# Patient Record
Sex: Female | Born: 1980
Health system: Southern US, Community
[De-identification: ages and names within clinical notes are randomized; demographics above are authoritative.]

## PROBLEM LIST (undated history)

## (undated) ENCOUNTER — Inpatient Hospital Stay (HOSPITAL_COMMUNITY): Payer: Self-pay

## (undated) DIAGNOSIS — R12 Heartburn: Secondary | ICD-10-CM

## (undated) DIAGNOSIS — M76829 Posterior tibial tendinitis, unspecified leg: Secondary | ICD-10-CM

## (undated) DIAGNOSIS — D649 Anemia, unspecified: Secondary | ICD-10-CM

## (undated) DIAGNOSIS — Z789 Other specified health status: Secondary | ICD-10-CM

## (undated) DIAGNOSIS — O26899 Other specified pregnancy related conditions, unspecified trimester: Secondary | ICD-10-CM

## (undated) HISTORY — DX: Posterior tibial tendinitis, unspecified leg: M76.829

## (undated) HISTORY — PX: DILATION AND CURETTAGE OF UTERUS: SHX78

---

## 2006-11-10 ENCOUNTER — Ambulatory Visit: Payer: Self-pay | Admitting: Internal Medicine

## 2006-11-10 DIAGNOSIS — R5382 Chronic fatigue, unspecified: Secondary | ICD-10-CM

## 2006-11-10 LAB — CONVERTED CEMR LAB
Basophils Absolute: 0.1 10*3/uL (ref 0.0–0.1)
Eosinophils Absolute: 0.3 10*3/uL (ref 0.0–0.7)
Eosinophils Relative: 3 % (ref 0–5)
Neutro Abs: 6.2 10*3/uL (ref 1.7–7.7)

## 2006-11-11 LAB — CONVERTED CEMR LAB
ALT: 11 units/L (ref 0–35)
AST: 16 units/L (ref 0–37)
Alkaline Phosphatase: 71 units/L (ref 39–117)
BUN: 8 mg/dL (ref 6–23)
HCT: 36.4 % (ref 36.0–46.0)
MCHC: 33.7 g/dL (ref 30.0–36.0)
MCV: 86.5 fL (ref 78.0–100.0)
Platelets: 388 10*3/uL (ref 150–400)
RDW: 12.7 % (ref 11.5–14.0)
Sodium: 137 meq/L (ref 135–145)
TSH: 1.268 microintl units/mL (ref 0.350–5.50)
Total Bilirubin: 0.4 mg/dL (ref 0.3–1.2)
Total Protein: 6.3 g/dL (ref 6.0–8.3)

## 2007-11-02 ENCOUNTER — Emergency Department (HOSPITAL_COMMUNITY): Admission: EM | Admit: 2007-11-02 | Discharge: 2007-11-02 | Payer: Self-pay | Admitting: Emergency Medicine

## 2008-01-24 ENCOUNTER — Telehealth (INDEPENDENT_AMBULATORY_CARE_PROVIDER_SITE_OTHER): Payer: Self-pay | Admitting: *Deleted

## 2008-06-04 ENCOUNTER — Ambulatory Visit (HOSPITAL_COMMUNITY): Admission: RE | Admit: 2008-06-04 | Discharge: 2008-06-04 | Payer: Self-pay | Admitting: Obstetrics and Gynecology

## 2008-06-04 ENCOUNTER — Encounter (HOSPITAL_COMMUNITY): Payer: Self-pay | Admitting: Obstetrics and Gynecology

## 2009-07-28 ENCOUNTER — Inpatient Hospital Stay (HOSPITAL_COMMUNITY): Admission: AD | Admit: 2009-07-28 | Discharge: 2009-08-01 | Payer: Self-pay | Admitting: Obstetrics and Gynecology

## 2009-07-29 ENCOUNTER — Encounter (INDEPENDENT_AMBULATORY_CARE_PROVIDER_SITE_OTHER): Payer: Self-pay | Admitting: Obstetrics and Gynecology

## 2010-04-13 LAB — CBC
Hemoglobin: 10.6 g/dL — ABNORMAL LOW (ref 12.0–15.0)
Hemoglobin: 12.4 g/dL (ref 12.0–15.0)
MCHC: 34.3 g/dL (ref 30.0–36.0)
Platelets: 237 10*3/uL (ref 150–400)
Platelets: 274 10*3/uL (ref 150–400)
RDW: 14.9 % (ref 11.5–15.5)
WBC: 9.9 10*3/uL (ref 4.0–10.5)

## 2010-05-06 LAB — CBC
HCT: 38.2 % (ref 36.0–46.0)
Hemoglobin: 13.6 g/dL (ref 12.0–15.0)
MCHC: 35.5 g/dL (ref 30.0–36.0)
Platelets: 324 10*3/uL (ref 150–400)
RDW: 12.8 % (ref 11.5–15.5)

## 2010-12-02 LAB — OB RESULTS CONSOLE GC/CHLAMYDIA
Chlamydia: NEGATIVE
Gonorrhea: NEGATIVE

## 2010-12-02 LAB — OB RESULTS CONSOLE ABO/RH: RH Type: POSITIVE

## 2010-12-02 LAB — OB RESULTS CONSOLE HEPATITIS B SURFACE ANTIGEN: Hepatitis B Surface Ag: NEGATIVE

## 2010-12-24 ENCOUNTER — Emergency Department (HOSPITAL_BASED_OUTPATIENT_CLINIC_OR_DEPARTMENT_OTHER): Payer: 59

## 2010-12-24 ENCOUNTER — Emergency Department (HOSPITAL_BASED_OUTPATIENT_CLINIC_OR_DEPARTMENT_OTHER)
Admission: EM | Admit: 2010-12-24 | Discharge: 2010-12-24 | Disposition: A | Discharge status: Home / Self Care | Payer: 59 | Attending: Emergency Medicine | Admitting: Emergency Medicine

## 2010-12-24 ENCOUNTER — Encounter: Payer: Self-pay | Admitting: Emergency Medicine

## 2010-12-24 DIAGNOSIS — E860 Dehydration: Secondary | ICD-10-CM | POA: Insufficient documentation

## 2010-12-24 DIAGNOSIS — R1110 Vomiting, unspecified: Secondary | ICD-10-CM

## 2010-12-24 DIAGNOSIS — B349 Viral infection, unspecified: Secondary | ICD-10-CM

## 2010-12-24 DIAGNOSIS — B9789 Other viral agents as the cause of diseases classified elsewhere: Secondary | ICD-10-CM | POA: Insufficient documentation

## 2010-12-24 DIAGNOSIS — O211 Hyperemesis gravidarum with metabolic disturbance: Secondary | ICD-10-CM | POA: Insufficient documentation

## 2010-12-24 LAB — URINALYSIS, ROUTINE W REFLEX MICROSCOPIC
Bilirubin Urine: NEGATIVE
Ketones, ur: 15 mg/dL — AB
Leukocytes, UA: NEGATIVE
Nitrite: NEGATIVE
Specific Gravity, Urine: 1.028 (ref 1.005–1.030)
Urobilinogen, UA: 0.2 mg/dL (ref 0.0–1.0)
pH: 6 (ref 5.0–8.0)

## 2010-12-24 MED ORDER — SODIUM CHLORIDE 0.9 % IV BOLUS (SEPSIS)
1000.0000 mL | Freq: Once | INTRAVENOUS | Status: DC
Start: 2010-12-24 — End: 2010-12-24

## 2010-12-24 MED ORDER — VITAMIN B-6 25 MG PO TABS: 25.0000 mg | ORAL_TABLET | Freq: Four times a day (QID) | ORAL | Status: DC | PRN

## 2010-12-24 MED ORDER — SODIUM CHLORIDE 0.9 % IV BOLUS (SEPSIS)
1000.0000 mL | Freq: Once | INTRAVENOUS | Status: AC
  Administered 2010-12-24: 1000 mL via INTRAVENOUS

## 2010-12-24 MED ORDER — ONDANSETRON 4 MG PO TBDP: 4.0000 mg | ORAL_TABLET | Freq: Three times a day (TID) | ORAL | Status: AC | PRN

## 2010-12-24 MED ORDER — ALBUTEROL SULFATE HFA 108 (90 BASE) MCG/ACT IN AERS: 1.0000 | INHALATION_SPRAY | RESPIRATORY_TRACT | Status: DC | PRN

## 2010-12-24 MED ORDER — ONDANSETRON HCL 4 MG/2ML IJ SOLN
4.0000 mg | Freq: Once | INTRAMUSCULAR | Status: AC
  Administered 2010-12-24: 4 mg via INTRAVENOUS
  Filled 2010-12-24: qty 2

## 2010-12-24 NOTE — ED Notes (Signed)
Family at bedside. 

## 2010-12-24 NOTE — ED Notes (Signed)
Pt presents with Body aches nausea and flu

## 2010-12-24 NOTE — ED Notes (Signed)
Pt unable to void at this time. 

## 2010-12-24 NOTE — ED Provider Notes (Signed)
History     CSN: [id number] Arrival date & time: 12/24/2010  7:34 AM   First MD Initiated Contact with Patient 12/24/10 [phone]      Chief Complaint  Patient presents with  . Nausea    persistant Nausea with body aches     HPI  30yoF G2P1 [redacted] weeks pregnant h/o asthma p/w multiple complaints. Patient complaining of approximately 4 days of fever, rhinorrhea, nasal congestion, sore throat, myalgias. Min cough. States that since onset of above her nausea and vomiting during this pregnancy is increased significantly. She has several episodes of emesis per day. She is unable to tolerate by mouth intake. Her husband feels like she is dehydrated. She has received an ultrasound in this pregnancy and has a known IUP. She denies abdominal pain, back pain, vaginal discharge, vaginal bleeding. Denies hematuria/dysuria/freq/urgency of urination. She did have a flu shot this season. She took tylenol pta but states she vomited shortly thereafter. Patient is Physician [facility].  History reviewed. No pertinent past medical history.  History reviewed. No pertinent past surgical history.  History reviewed. No pertinent family history.  History  Substance Use Topics  . Smoking status: Never Smoker   . Smokeless tobacco: Not on file  . Alcohol Use: No    OB History    Grav Para Term Preterm Abortions TAB SAB Ect Mult Living   1              Review of Systems  All other systems reviewed and are negative.  except as noted HPI  Allergies  Review of patient's allergies indicates no known allergies.  Home Medications   Current Outpatient Rx  Name Route Sig Dispense Refill  . ALBUTEROL SULFATE 2 MG/5ML PO SYRP Oral Take 2 mg by mouth 3 (three) times daily.      Marland Kitchen PRENATAL 27-0.8 MG PO TABS Oral Take 1 tablet by mouth daily.      . ALBUTEROL SULFATE HFA 108 (90 BASE) MCG/ACT IN AERS Inhalation Inhale 1-2 puffs into the lungs every 4 (four) hours as needed for wheezing. 1  Inhaler 10  . ONDANSETRON 4 MG PO TBDP Oral Take 1 tablet (4 mg total) by mouth every 8 (eight) hours as needed for nausea. 20 tablet 2  . VITAMIN B-6 25 MG PO TABS Oral Take 1 tablet (25 mg total) by mouth every 6 (six) hours as needed. 30 tablet 2    BP 96/45  Pulse 107  Temp(Src) 98.1 F (36.7 C) (Oral)  Resp 18  Ht 5\' 2"  (1.575 m)  Wt 186 lb (84.369 kg)  BMI 34.02 kg/m2  SpO2 100%  LMP 09/13/2010  Physical Exam  Nursing note and vitals reviewed. Constitutional: She is oriented to person, place, and time. She appears well-developed.  HENT:  Head: Atraumatic.  Mouth/Throat: No oropharyngeal exudate.       Nasal congestion Mild posterior OP erythema TM b/l without erythema Lt TM dull light relex  Eyes: Conjunctivae and EOM are normal. Pupils are equal, round, and reactive to light.  Neck: Normal range of motion. Neck supple.  Cardiovascular: Normal rate, regular rhythm, normal heart sounds and intact distal pulses.   Pulmonary/Chest: Effort normal and breath sounds normal. No respiratory distress. She has no wheezes. She has no rales.  Abdominal: Soft. She exhibits no distension. There is no tenderness. There is no rebound and no guarding.  Musculoskeletal: Normal range of motion.  Neurological: She is alert and oriented to person, place, and  time.  Skin: Skin is warm and dry. No rash noted.  Psychiatric: She has a normal mood and affect.    ED Course  Procedures (including critical care time)  Labs Reviewed  URINALYSIS, ROUTINE W REFLEX MICROSCOPIC - Abnormal; Notable for the following:    Ketones, ur 15 (*)    All other components within normal limits  RAPID STREP SCREEN  STREP A DNA PROBE   No results found.   1. Viral syndrome   2. Dehydration   3. Hyperemesis     MDM  Likely viral syndrome. Strep negative, sent for culture.  Mild hyperemesis/dehydration.  4742 Patient states nausea resolved. Still unable to produce urine same. Additional 1L IVF ordered.  Reassess.  1157AM Pt feels like she's ready to go home. Aware strep culture pending. U/A with 15 ketones. Given additional 1L IVF here. She is tolerating PO without vomiting or nausea. Home with OBGYN f/u, Vit B6, zofran. Will refill her albuterol inhaler.        Forbes Cellar, MD 12/24/10 1159

## 2010-12-25 LAB — STREP A DNA PROBE: Group A Strep Probe: NEGATIVE

## 2011-03-14 ENCOUNTER — Inpatient Hospital Stay (HOSPITAL_COMMUNITY)
Admission: AD | Admit: 2011-03-14 | Discharge: 2011-03-14 | Disposition: A | Discharge status: Home / Self Care | Payer: 59 | Source: Ambulatory Visit | Attending: Obstetrics and Gynecology | Admitting: Obstetrics and Gynecology

## 2011-03-14 ENCOUNTER — Encounter (HOSPITAL_COMMUNITY): Payer: Self-pay | Admitting: *Deleted

## 2011-03-14 DIAGNOSIS — N760 Acute vaginitis: Secondary | ICD-10-CM | POA: Insufficient documentation

## 2011-03-14 DIAGNOSIS — O479 False labor, unspecified: Secondary | ICD-10-CM

## 2011-03-14 DIAGNOSIS — A499 Bacterial infection, unspecified: Secondary | ICD-10-CM | POA: Insufficient documentation

## 2011-03-14 DIAGNOSIS — O2390 Unspecified genitourinary tract infection in pregnancy, unspecified trimester: Secondary | ICD-10-CM | POA: Insufficient documentation

## 2011-03-14 DIAGNOSIS — O4700 False labor before 37 completed weeks of gestation, unspecified trimester: Secondary | ICD-10-CM | POA: Insufficient documentation

## 2011-03-14 DIAGNOSIS — B9689 Other specified bacterial agents as the cause of diseases classified elsewhere: Secondary | ICD-10-CM | POA: Insufficient documentation

## 2011-03-14 LAB — URINALYSIS, ROUTINE W REFLEX MICROSCOPIC
Bilirubin Urine: NEGATIVE
Ketones, ur: NEGATIVE mg/dL
Leukocytes, UA: NEGATIVE
Protein, ur: NEGATIVE mg/dL
pH: 6.5 (ref 5.0–8.0)

## 2011-03-14 LAB — WET PREP, GENITAL

## 2011-03-14 MED ORDER — METRONIDAZOLE 500 MG PO TABS: 500.0000 mg | ORAL_TABLET | Freq: Two times a day (BID) | ORAL | Status: AC

## 2011-03-14 NOTE — ED Provider Notes (Signed)
History     Chief Complaint  Patient presents with  . Vaginal Bleeding  . Contractions   HPI  Pt here with report of intermittent contractions, reports 3 in 1.5 hours; states they began yesterday.  Denies vaginal bleeding, UTI symptoms, or leaking of fluid.  No abnormal vaginal discharge.  Past Medical History  Diagnosis Date  . Asthma     Past Surgical History  Procedure Date  . Dilation and curettage of uterus   . Cesarean section     Family History  Problem Relation Age of Onset  . Anesthesia problems Neg Hx   . Hypotension Neg Hx   . Pseudochol deficiency Neg Hx   . Malignant hyperthermia Neg Hx     History  Substance Use Topics  . Smoking status: Never Smoker   . Smokeless tobacco: Not on file  . Alcohol Use: No    Allergies: No Known Allergies  Prescriptions prior to admission  Medication Sig Dispense Refill  . albuterol (PROVENTIL HFA;VENTOLIN HFA) 108 (90 BASE) MCG/ACT inhaler Inhale 1-2 puffs into the lungs every 4 (four) hours as needed.      Marland Kitchen albuterol (PROVENTIL,VENTOLIN) 2 MG/5ML syrup Take 2 mg by mouth 3 (three) times daily.        . Prenatal Vit-Fe Fumarate-FA (PRENATAL MULTIVITAMIN) TABS Take 1 tablet by mouth daily.      Marland Kitchen DISCONTD: albuterol (PROVENTIL HFA;VENTOLIN HFA) 108 (90 BASE) MCG/ACT inhaler Inhale 1-2 puffs into the lungs every 4 (four) hours as needed for wheezing.  1 Inhaler  10    Review of Systems  Gastrointestinal: Positive for abdominal pain (braxton hicks).  All other systems reviewed and are negative.   Physical Exam   Temperature 98 F (36.7 C), temperature source Oral, height 5\' 2"  (1.575 m), weight 94.348 kg (208 lb), last menstrual period 09/13/2010, unknown if currently breastfeeding.  Physical Exam  Constitutional: She is oriented to person, place, and time. She appears well-developed and well-nourished. No distress.  HENT:  Head: Normocephalic.  Neck: Normal range of motion. Neck supple.  Cardiovascular: Normal  rate, regular rhythm and normal heart sounds.   Respiratory: Effort normal and breath sounds normal.  GI: Soft. There is no tenderness.  Genitourinary: No bleeding around the vagina. Vaginal discharge (mucusy) found.       Cervix - long/thick/closed  Neurological: She is alert and oriented to person, place, and time.  Skin: Skin is warm and dry.  FHR 140's, 10x10 Toco none  MAU Course  Procedures  Results for orders placed during the hospital encounter of 03/14/11 (from the past 24 hour(s))  WET PREP, GENITAL     Status: Abnormal   Collection Time   03/14/11  9:40 PM      Component Value Range   Yeast Wet Prep HPF POC NONE SEEN  NONE SEEN    Trich, Wet Prep NONE SEEN  NONE SEEN    Clue Cells Wet Prep HPF POC MODERATE (*) NONE SEEN    WBC, Wet Prep HPF POC FEW (*) NONE SEEN    Consulted with Dr. Gery Pray  HPI/Exam>ok to discharge home  Assessment and Plan  Bacterial Vaginosis Braxton Hicks  Plan: RX Flagyl Provided reassurance Reviewed signs of labor  [facility] 03/14/2011, 10:08 PM

## 2011-03-14 NOTE — Progress Notes (Signed)
Written and verbal d/c instructions given and understanding voiced. 

## 2011-03-14 NOTE — Progress Notes (Signed)
G3P1 at 26wks. Pelvic pressure and discomfort for awhile but worse Thurs and Fri. Some brown spotting today and contractions. No intercourse recently.

## 2011-03-14 NOTE — Progress Notes (Signed)
Threasa Heads CNM in to do spec exam. Wet prep only obtained and bimanual. Pt tol well.

## 2011-03-14 NOTE — Progress Notes (Signed)
EFM strip reviewed by W. Muhammed CNM. OK to d/c efm per CNM

## 2011-05-31 ENCOUNTER — Encounter (HOSPITAL_COMMUNITY): Payer: Self-pay

## 2011-06-08 ENCOUNTER — Inpatient Hospital Stay (HOSPITAL_COMMUNITY)
Admission: AD | Admit: 2011-06-08 | Discharge: 2011-06-08 | Disposition: A | Discharge status: Hospice | Payer: 59 | Source: Ambulatory Visit | Attending: Obstetrics and Gynecology | Admitting: Obstetrics and Gynecology

## 2011-06-08 ENCOUNTER — Encounter (HOSPITAL_COMMUNITY): Payer: Self-pay | Admitting: *Deleted

## 2011-06-08 DIAGNOSIS — O479 False labor, unspecified: Secondary | ICD-10-CM | POA: Insufficient documentation

## 2011-06-08 NOTE — MAU Note (Signed)
Pt stated she has had braxton hicks ctx for weeks; today she reports having 4 really strong ctx in 1 1/2 hrs time.  Denies bleeding, but says she feels some wetness in her underware.

## 2011-06-08 NOTE — Progress Notes (Signed)
38 2/7 weeks Had some UCs at [facility] mild now.  Had urge to void today but no leaking, no bleeding, no pain with urination. Normal FM  Cx about 1 1/2cm per nurse check  FHT reactive UCs mild , irreg  A: Prob bladder spasm resolved  P: D/C home      Fluids, rest      [facility]

## 2011-06-10 ENCOUNTER — Encounter (HOSPITAL_COMMUNITY)
Admission: RE | Admit: 2011-06-10 | Discharge: 2011-06-10 | Disposition: A | Discharge status: Home / Self Care | Payer: 59 | Source: Ambulatory Visit | Attending: Obstetrics and Gynecology | Admitting: Obstetrics and Gynecology

## 2011-06-10 ENCOUNTER — Encounter (HOSPITAL_COMMUNITY): Payer: Self-pay

## 2011-06-10 HISTORY — DX: Heartburn: R12

## 2011-06-10 HISTORY — DX: Other specified health status: Z78.9

## 2011-06-10 HISTORY — DX: Other specified pregnancy related conditions, unspecified trimester: O26.899

## 2011-06-10 LAB — CBC
HCT: 36.6 % (ref 36.0–46.0)
MCHC: 32.2 g/dL (ref 30.0–36.0)
MCV: 83.8 fL (ref 78.0–100.0)
Platelets: 294 10*3/uL (ref 150–400)
RDW: 14.4 % (ref 11.5–15.5)

## 2011-06-10 LAB — SURGICAL PCR SCREEN: Staphylococcus aureus: NEGATIVE

## 2011-06-10 NOTE — Patient Instructions (Addendum)
YOUR PROCEDURE IS SCHEDULED ON:06/15/11  ENTER THROUGH THE MAIN ENTRANCE OF [facility] AT:1130 am  USE DESK PHONE AND DIAL [phone] TO INFORM us OF YOUR ARRIVAL  CALL [phone] IF YOU HAVE ANY QUESTIONS OR PROBLEMS PRIOR TO YOUR ARRIVAL.  REMEMBER: DO NOT EAT AFTER MIDNIGHT :  SPECIAL INSTRUCTIONS:   YOU MAY BRUSH YOUR TEETH THE MORNING OF SURGERY   TAKE THESE MEDICINES THE DAY OF SURGERY WITH SIP OF WATER:none   DO NOT WEAR JEWELRY, EYE MAKEUP, LIPSTICK OR DARK FINGERNAIL POLISH DO NOT WEAR LOTIONS  DO NOT SHAVE FOR 48 HOURS PRIOR TO SURGERY  YOU WILL NOT BE ALLOWED TO DRIVE YOURSELF HOME.  NAME OF DRIVER:Jenny Porter

## 2011-06-14 MED ORDER — DEXTROSE 5 % IV SOLN
2.0000 g | INTRAVENOUS | Status: AC
  Administered 2011-06-15: 2 g via INTRAVENOUS
  Filled 2011-06-14: qty 2

## 2011-06-14 NOTE — H&P (Addendum)
31 yo G3P1 @ 39+2 wks presents for rpt c-section.  Declines TOL  PMHx:  Asthma Meds:  Albuterol prn (rare), PNV All:  None PSHx:  C-section, D&E SHx:  Negative FHX:  n/c  AF, VSS GEn - NAD CV - RRR Lungs - clear bilaterally Abd - gravid, NT Ext - bilateral edema  A/P:  Prior c-section, desires repeat Plan of care discussed, informed consent

## 2011-06-15 ENCOUNTER — Encounter (HOSPITAL_COMMUNITY)
Admission: RE | Disposition: A | Discharge status: Home / Self Care | Payer: Self-pay | Source: Ambulatory Visit | Attending: Obstetrics and Gynecology

## 2011-06-15 ENCOUNTER — Encounter (HOSPITAL_COMMUNITY): Payer: Self-pay | Admitting: Anesthesiology

## 2011-06-15 ENCOUNTER — Encounter (HOSPITAL_COMMUNITY): Payer: Self-pay | Admitting: *Deleted

## 2011-06-15 ENCOUNTER — Inpatient Hospital Stay (HOSPITAL_COMMUNITY): Payer: 59 | Admitting: Anesthesiology

## 2011-06-15 ENCOUNTER — Inpatient Hospital Stay (HOSPITAL_COMMUNITY)
Admission: RE | Admit: 2011-06-15 | Discharge: 2011-06-18 | DRG: 766 | Disposition: A | Discharge status: Home / Self Care | Payer: 59 | Source: Ambulatory Visit | Attending: Obstetrics and Gynecology | Admitting: Obstetrics and Gynecology

## 2011-06-15 DIAGNOSIS — O34219 Maternal care for unspecified type scar from previous cesarean delivery: Principal | ICD-10-CM | POA: Diagnosis present

## 2011-06-15 DIAGNOSIS — R5382 Chronic fatigue, unspecified: Secondary | ICD-10-CM

## 2011-06-15 SURGERY — Surgical Case
Anesthesia: Epidural | Site: Abdomen | Wound class: Clean Contaminated

## 2011-06-15 MED ORDER — LACTATED RINGERS IV SOLN
INTRAVENOUS | Status: DC | PRN
  Administered 2011-06-15 (×4): via INTRAVENOUS

## 2011-06-15 MED ORDER — NALBUPHINE HCL 10 MG/ML IJ SOLN: 5.0000 mg | INTRAMUSCULAR | Status: DC | PRN

## 2011-06-15 MED ORDER — SODIUM CHLORIDE 0.9 % IJ SOLN: 3.0000 mL | INTRAMUSCULAR | Status: DC | PRN

## 2011-06-15 MED ORDER — DIBUCAINE 1 % RE OINT: 1.0000 "application " | TOPICAL_OINTMENT | RECTAL | Status: DC | PRN

## 2011-06-15 MED ORDER — LACTATED RINGERS IV SOLN
Freq: Once | INTRAVENOUS | Status: AC
  Administered 2011-06-15: 12:00:00 via INTRAVENOUS

## 2011-06-15 MED ORDER — MORPHINE SULFATE (PF) 0.5 MG/ML IJ SOLN
INTRAMUSCULAR | Status: DC | PRN
  Administered 2011-06-15: .1 mg via INTRATHECAL

## 2011-06-15 MED ORDER — BUPIVACAINE IN DEXTROSE 0.75-8.25 % IT SOLN
INTRATHECAL | Status: DC | PRN
  Administered 2011-06-15: 11.75 mg via INTRATHECAL

## 2011-06-15 MED ORDER — MEDROXYPROGESTERONE ACETATE 150 MG/ML IM SUSP: 150.0000 mg | INTRAMUSCULAR | Status: DC | PRN

## 2011-06-15 MED ORDER — ALBUTEROL SULFATE HFA 108 (90 BASE) MCG/ACT IN AERS
1.0000 | INHALATION_SPRAY | RESPIRATORY_TRACT | Status: DC | PRN
  Filled 2011-06-15: qty 6.7

## 2011-06-15 MED ORDER — SIMETHICONE 80 MG PO CHEW
80.0000 mg | CHEWABLE_TABLET | Freq: Three times a day (TID) | ORAL | Status: DC
  Administered 2011-06-15 – 2011-06-17 (×8): 80 mg via ORAL

## 2011-06-15 MED ORDER — IBUPROFEN 600 MG PO TABS
600.0000 mg | ORAL_TABLET | Freq: Four times a day (QID) | ORAL | Status: DC
  Administered 2011-06-15 – 2011-06-18 (×10): 600 mg via ORAL
  Filled 2011-06-15 (×10): qty 1

## 2011-06-15 MED ORDER — OXYTOCIN 20 UNITS IN LACTATED RINGERS INFUSION - SIMPLE
INTRAVENOUS | Status: AC
  Filled 2011-06-15: qty 1000

## 2011-06-15 MED ORDER — SCOPOLAMINE 1 MG/3DAYS TD PT72
MEDICATED_PATCH | TRANSDERMAL | Status: AC
  Administered 2011-06-15: 1.5 mg via TRANSDERMAL
  Filled 2011-06-15: qty 1

## 2011-06-15 MED ORDER — TETANUS-DIPHTH-ACELL PERTUSSIS 5-2.5-18.5 LF-MCG/0.5 IM SUSP: 0.5000 mL | Freq: Once | INTRAMUSCULAR | Status: DC

## 2011-06-15 MED ORDER — SODIUM CHLORIDE 0.9 % IV SOLN: 1.0000 ug/kg/h | INTRAVENOUS | Status: DC | PRN

## 2011-06-15 MED ORDER — PHENYLEPHRINE 40 MCG/ML (10ML) SYRINGE FOR IV PUSH (FOR BLOOD PRESSURE SUPPORT)
PREFILLED_SYRINGE | INTRAVENOUS | Status: AC
  Filled 2011-06-15: qty 5

## 2011-06-15 MED ORDER — KETOROLAC TROMETHAMINE 30 MG/ML IJ SOLN: 30.0000 mg | Freq: Four times a day (QID) | INTRAMUSCULAR | Status: AC | PRN

## 2011-06-15 MED ORDER — ONDANSETRON HCL 4 MG/2ML IJ SOLN: 4.0000 mg | Freq: Three times a day (TID) | INTRAMUSCULAR | Status: DC | PRN

## 2011-06-15 MED ORDER — ONDANSETRON HCL 4 MG/2ML IJ SOLN: 4.0000 mg | INTRAMUSCULAR | Status: DC | PRN

## 2011-06-15 MED ORDER — METOCLOPRAMIDE HCL 5 MG/ML IJ SOLN: 10.0000 mg | Freq: Three times a day (TID) | INTRAMUSCULAR | Status: DC | PRN

## 2011-06-15 MED ORDER — MEASLES, MUMPS & RUBELLA VAC ~~LOC~~ INJ: 0.5000 mL | INJECTION | Freq: Once | SUBCUTANEOUS | Status: DC

## 2011-06-15 MED ORDER — DIPHENHYDRAMINE HCL 50 MG/ML IJ SOLN: 12.5000 mg | INTRAMUSCULAR | Status: DC | PRN

## 2011-06-15 MED ORDER — FENTANYL CITRATE 0.05 MG/ML IJ SOLN: 25.0000 ug | INTRAMUSCULAR | Status: DC | PRN

## 2011-06-15 MED ORDER — ONDANSETRON HCL 4 MG PO TABS: 4.0000 mg | ORAL_TABLET | ORAL | Status: DC | PRN

## 2011-06-15 MED ORDER — ONDANSETRON HCL 4 MG/2ML IJ SOLN
INTRAMUSCULAR | Status: DC | PRN
  Administered 2011-06-15: 4 mg via INTRAVENOUS

## 2011-06-15 MED ORDER — ONDANSETRON HCL 4 MG/2ML IJ SOLN
INTRAMUSCULAR | Status: AC
  Filled 2011-06-15: qty 2

## 2011-06-15 MED ORDER — IBUPROFEN 600 MG PO TABS: 600.0000 mg | ORAL_TABLET | Freq: Four times a day (QID) | ORAL | Status: DC | PRN

## 2011-06-15 MED ORDER — SENNOSIDES-DOCUSATE SODIUM 8.6-50 MG PO TABS
2.0000 | ORAL_TABLET | Freq: Every day | ORAL | Status: DC
  Administered 2011-06-15 – 2011-06-16 (×2): 2 via ORAL

## 2011-06-15 MED ORDER — OXYTOCIN 20 UNITS IN LACTATED RINGERS INFUSION - SIMPLE
125.0000 mL/h | INTRAVENOUS | Status: AC
  Administered 2011-06-15: 125 mL/h via INTRAVENOUS

## 2011-06-15 MED ORDER — METOCLOPRAMIDE HCL 5 MG/ML IJ SOLN
INTRAMUSCULAR | Status: AC
  Filled 2011-06-15: qty 2

## 2011-06-15 MED ORDER — NALOXONE HCL 0.4 MG/ML IJ SOLN: 0.4000 mg | INTRAMUSCULAR | Status: DC | PRN

## 2011-06-15 MED ORDER — DIPHENHYDRAMINE HCL 25 MG PO CAPS: 25.0000 mg | ORAL_CAPSULE | Freq: Four times a day (QID) | ORAL | Status: DC | PRN

## 2011-06-15 MED ORDER — DIPHENHYDRAMINE HCL 50 MG/ML IJ SOLN: 25.0000 mg | INTRAMUSCULAR | Status: DC | PRN

## 2011-06-15 MED ORDER — OXYTOCIN 20 UNITS IN LACTATED RINGERS INFUSION - SIMPLE
INTRAVENOUS | Status: DC | PRN
  Administered 2011-06-15: 20 [IU] via INTRAVENOUS

## 2011-06-15 MED ORDER — WITCH HAZEL-GLYCERIN EX PADS: 1.0000 "application " | MEDICATED_PAD | CUTANEOUS | Status: DC | PRN

## 2011-06-15 MED ORDER — SIMETHICONE 80 MG PO CHEW: 80.0000 mg | CHEWABLE_TABLET | ORAL | Status: DC | PRN

## 2011-06-15 MED ORDER — DIPHENHYDRAMINE HCL 25 MG PO CAPS: 25.0000 mg | ORAL_CAPSULE | ORAL | Status: DC | PRN

## 2011-06-15 MED ORDER — LANOLIN HYDROUS EX OINT: 1.0000 "application " | TOPICAL_OINTMENT | CUTANEOUS | Status: DC | PRN

## 2011-06-15 MED ORDER — PHENYLEPHRINE HCL 10 MG/ML IJ SOLN
INTRAMUSCULAR | Status: DC | PRN
  Administered 2011-06-15: 40 ug via INTRAVENOUS
  Administered 2011-06-15: 80 ug via INTRAVENOUS
  Administered 2011-06-15: 40 ug via INTRAVENOUS
  Administered 2011-06-15 (×2): 80 ug via INTRAVENOUS
  Administered 2011-06-15 (×2): 40 ug via INTRAVENOUS

## 2011-06-15 MED ORDER — METOCLOPRAMIDE HCL 5 MG/ML IJ SOLN
INTRAMUSCULAR | Status: DC | PRN
  Administered 2011-06-15 (×2): 5 mg via INTRAVENOUS

## 2011-06-15 MED ORDER — KETOROLAC TROMETHAMINE 60 MG/2ML IM SOLN
60.0000 mg | Freq: Once | INTRAMUSCULAR | Status: AC | PRN
  Administered 2011-06-15: 60 mg via INTRAMUSCULAR

## 2011-06-15 MED ORDER — KETOROLAC TROMETHAMINE 60 MG/2ML IM SOLN
INTRAMUSCULAR | Status: AC
  Administered 2011-06-15: 60 mg via INTRAMUSCULAR
  Filled 2011-06-15: qty 2

## 2011-06-15 MED ORDER — DEXTROSE IN LACTATED RINGERS 5 % IV SOLN
INTRAVENOUS | Status: DC
  Administered 2011-06-15: 23:00:00 via INTRAVENOUS

## 2011-06-15 MED ORDER — SCOPOLAMINE 1 MG/3DAYS TD PT72: 1.0000 | MEDICATED_PATCH | Freq: Once | TRANSDERMAL | Status: DC

## 2011-06-15 MED ORDER — FENTANYL CITRATE 0.05 MG/ML IJ SOLN
INTRAMUSCULAR | Status: DC | PRN
  Administered 2011-06-15: 15 ug via INTRATHECAL

## 2011-06-15 MED ORDER — SCOPOLAMINE 1 MG/3DAYS TD PT72
1.0000 | MEDICATED_PATCH | Freq: Once | TRANSDERMAL | Status: DC
  Administered 2011-06-15: 1.5 mg via TRANSDERMAL

## 2011-06-15 MED ORDER — ACETAMINOPHEN 500 MG PO TABS
1000.0000 mg | ORAL_TABLET | Freq: Four times a day (QID) | ORAL | Status: DC | PRN
  Administered 2011-06-15 – 2011-06-17 (×4): 1000 mg via ORAL
  Filled 2011-06-15 (×4): qty 2

## 2011-06-15 MED ORDER — PRENATAL MULTIVITAMIN CH
1.0000 | ORAL_TABLET | Freq: Every day | ORAL | Status: DC
  Administered 2011-06-16 – 2011-06-17 (×2): 1 via ORAL
  Filled 2011-06-15 (×3): qty 1

## 2011-06-15 MED ORDER — OXYCODONE-ACETAMINOPHEN 5-325 MG PO TABS
1.0000 | ORAL_TABLET | ORAL | Status: DC | PRN
  Administered 2011-06-16 (×4): 1 via ORAL
  Filled 2011-06-15 (×5): qty 1

## 2011-06-15 MED ORDER — OXYTOCIN 10 UNIT/ML IJ SOLN
INTRAMUSCULAR | Status: AC
  Filled 2011-06-15: qty 4

## 2011-06-15 MED ORDER — EPHEDRINE SULFATE 50 MG/ML IJ SOLN
INTRAMUSCULAR | Status: DC | PRN
  Administered 2011-06-15: 10 mg via INTRAVENOUS
  Administered 2011-06-15: 5 mg via INTRAVENOUS
  Administered 2011-06-15: 10 mg via INTRAVENOUS
  Administered 2011-06-15: 5 mg via INTRAVENOUS

## 2011-06-15 MED ORDER — MEPERIDINE HCL 25 MG/ML IJ SOLN: 6.2500 mg | INTRAMUSCULAR | Status: DC | PRN

## 2011-06-15 MED ORDER — MENTHOL 3 MG MT LOZG: 1.0000 | LOZENGE | OROMUCOSAL | Status: DC | PRN

## 2011-06-15 MED ORDER — FENTANYL CITRATE 0.05 MG/ML IJ SOLN
INTRAMUSCULAR | Status: AC
  Filled 2011-06-15: qty 2

## 2011-06-15 MED ORDER — MORPHINE SULFATE 0.5 MG/ML IJ SOLN
INTRAMUSCULAR | Status: AC
  Filled 2011-06-15: qty 10

## 2011-06-15 MED ORDER — LACTATED RINGERS IV SOLN: INTRAVENOUS | Status: DC

## 2011-06-15 SURGICAL SUPPLY — 28 items
CHLORAPREP W/TINT 26ML (MISCELLANEOUS) ×2 IMPLANT
CLOTH BEACON ORANGE TIMEOUT ST (SAFETY) ×2 IMPLANT
DERMABOND ADVANCED (GAUZE/BANDAGES/DRESSINGS) ×1
DERMABOND ADVANCED .7 DNX12 (GAUZE/BANDAGES/DRESSINGS) ×1 IMPLANT
ELECT REM PT RETURN 9FT ADLT (ELECTROSURGICAL) ×2
ELECTRODE REM PT RTRN 9FT ADLT (ELECTROSURGICAL) ×1 IMPLANT
EXTRACTOR VACUUM M CUP 4 TUBE (SUCTIONS) IMPLANT
GLOVE BIO SURGEON STRL SZ 6.5 (GLOVE) ×2 IMPLANT
GLOVE BIOGEL PI IND STRL 7.0 (GLOVE) ×2 IMPLANT
GLOVE BIOGEL PI INDICATOR 7.0 (GLOVE) ×2
GOWN PREVENTION PLUS LG XLONG (DISPOSABLE) ×6 IMPLANT
GOWN STRL REIN XL XLG (GOWN DISPOSABLE) ×2 IMPLANT
KIT ABG SYR 3ML LUER SLIP (SYRINGE) IMPLANT
NEEDLE HYPO 25X5/8 SAFETYGLIDE (NEEDLE) ×2 IMPLANT
NEEDLE KEITH (NEEDLE) ×2 IMPLANT
NS IRRIG 1000ML POUR BTL (IV SOLUTION) ×4 IMPLANT
PACK C SECTION WH (CUSTOM PROCEDURE TRAY) ×2 IMPLANT
SLEEVE SCD COMPRESS KNEE MED (MISCELLANEOUS) IMPLANT
STAPLER VISISTAT 35W (STAPLE) IMPLANT
SUT CHROMIC 0 CT 802H (SUTURE) IMPLANT
SUT CHROMIC 0 CTX 36 (SUTURE) ×6 IMPLANT
SUT MNCRL AB 3-0 PS2 27 (SUTURE) IMPLANT
SUT MON AB-0 CT1 36 (SUTURE) ×2 IMPLANT
SUT PDS AB 0 CTX 60 (SUTURE) ×2 IMPLANT
SUT PLAIN 0 NONE (SUTURE) IMPLANT
TOWEL OR 17X24 6PK STRL BLUE (TOWEL DISPOSABLE) ×4 IMPLANT
TRAY FOLEY CATH 14FR (SET/KITS/TRAYS/PACK) IMPLANT
WATER STERILE IRR 1000ML POUR (IV SOLUTION) IMPLANT

## 2011-06-15 NOTE — Op Note (Signed)
Cesarean Section Procedure Note   Jenny Porter  06/15/2011  Indications: Scheduled Proceedure/Maternal Request   Pre-operative Diagnosis: PREVIOUS CESAREAN SECTION .   Post-operative Diagnosis: Same   Surgeon: Surgeon(s) and Role:    * Zelphia Cairo, MD - Primary   Assistants: none  Anesthesia: spinal   Procedure Details:  The patient was seen in the Holding Room. The risks, benefits, complications, treatment options, and expected outcomes were discussed with the patient. The patient concurred with the proposed plan, giving informed consent. identified as Jenny Porter and the procedure verified as C-Section Delivery. A Time Out was held and the above information confirmed.  After inductio n of anesthesia, the patient was draped and prepped in the usual sterile manner. A transverse was made and carried down through the subcutaneous tissue to the fascia. Fascial incision was made and extended transversely. The fascia was separated from the underlying rectus tissue superiorly and inferiorly. The peritoneum was identified and entered. Peritoneal incision was extended longitudinally. The utero-vesical peritoneal reflection was incised transversely and the bladder flap was bluntly freed from the lower uterine segment. A low transverse uterine incision was made. Delivered from cephalic presentation was a female infant with Apgar scores of 8 at one minute and 9 at five minutes. Cord ph was not sent the umbilical cord was clamped and cut cord blood was obtained for evaluation. The placenta was removed Intact and appeared normal. The uterine outline, tubes and ovaries appeared normal}. The uterine incision was closed with running locked sutures of 0chromic gut.   Hemostasis was observed. Lavage was carried out until clear. The fascia was then reapproximated with running sutures of 0PDS. The skin was closed with vicryl.   Instrument, sponge, and needle counts were correct prior the  abdominal closure and were correct at the conclusion of the case.      Estimated Blood Loss: 600cc  Urine Output: clear  Specimens: @ORSPECIMEN @   Complications: no complications  Disposition: PACU - hemodynamically stable.   Maternal Condition: stable   Baby condition / location:  nursery-stable  Attending Attestation: I was present and scrubbed for the entire procedure.   Signed: Surgeon(s): Zelphia Cairo, MD

## 2011-06-15 NOTE — Transfer of Care (Signed)
Immediate Anesthesia Transfer of Care Note  Patient: Jenny Porter  Procedure(s) Performed: Procedure(s) (LRB): CESAREAN SECTION (N/A)  Patient Location: PACU  Anesthesia Type: Spinal  Level of Consciousness: awake, alert  and oriented  Airway & Oxygen Therapy: Patient Spontanous Breathing  Post-op Assessment: Report given to PACU RN and Post -op Vital signs reviewed and stable  Post vital signs: Reviewed and stable  Complications: No apparent anesthesia complications

## 2011-06-15 NOTE — Anesthesia Procedure Notes (Signed)
Spinal  Patient location during procedure: OR Start time: 06/15/2011 12:58 PM Staffing Performed by: anesthesiologist  Preanesthetic Checklist Completed: patient identified, site marked, surgical consent, pre-op evaluation, timeout performed, IV checked, risks and benefits discussed and monitors and equipment checked Spinal Block Patient position: sitting Prep: site prepped and draped and DuraPrep Patient monitoring: heart rate, cardiac monitor, continuous pulse ox and blood pressure Approach: midline Location: L3-4 Injection technique: single-shot Needle Needle type: Sprotte  Needle gauge: 24 G Needle length: 9 cm Assessment Sensory level: T4 Additional Notes Clear free flow CSF on first attempt.  No paresthesia.  Patient tolerated procedure well.  Jasmine December, MD

## 2011-06-15 NOTE — Anesthesia Preprocedure Evaluation (Signed)
Anesthesia Evaluation  Patient identified by MRN, date of birth, ID band Patient awake    Reviewed: Allergy & Precautions, H&P , NPO status , Patient's Chart, lab work & pertinent test results, reviewed documented beta blocker date and time   History of Anesthesia Complications Negative for: history of anesthetic complications  Airway Mallampati: I TM Distance: >3 FB Neck ROM: full    Dental  (+) Teeth Intact   Pulmonary asthma (no recent inhaler use) ,  breath sounds clear to auscultation        Cardiovascular negative cardio ROS  Rhythm:regular Rate:Normal     Neuro/Psych negative neurological ROS  negative psych ROS   GI/Hepatic Neg liver ROS, GERD- (tums)  Medicated,  Endo/Other  Morbid obesity  Renal/GU negative Renal ROS  negative genitourinary   Musculoskeletal   Abdominal   Peds  Hematology negative hematology ROS (+)   Anesthesia Other Findings   Reproductive/Obstetrics (+) Pregnancy (h/o c/s x1)                           Anesthesia Physical Anesthesia Plan  ASA: II  Anesthesia Plan: Epidural   Post-op Pain Management:    Induction:   Airway Management Planned:   Additional Equipment:   Intra-op Plan:   Post-operative Plan:   Informed Consent: I have reviewed the patients History and Physical, chart, labs and discussed the procedure including the risks, benefits and alternatives for the proposed anesthesia with the patient or authorized representative who has indicated his/her understanding and acceptance.     Plan Discussed with: CRNA and Surgeon  Anesthesia Plan Comments:         Anesthesia Quick Evaluation

## 2011-06-15 NOTE — Addendum Note (Signed)
Addendum  created 06/15/11 1653 by Dana Allan, MD   Modules edited:Orders

## 2011-06-15 NOTE — Anesthesia Postprocedure Evaluation (Signed)
Anesthesia Post Note  Patient: Jenny Porter  Procedure(s) Performed: Procedure(s) (LRB): CESAREAN SECTION (N/A)  Anesthesia type: Spinal  Patient location: PACU  Post pain: Pain level controlled  Post assessment: Post-op Vital signs reviewed  Last Vitals:  Filed Vitals:   06/15/11 1430  BP: 110/57  Pulse: 75  Temp:   Resp: 24    Post vital signs: Reviewed  Level of consciousness: awake  Complications: No apparent anesthesia complications

## 2011-06-16 LAB — CBC
MCH: 26.7 pg (ref 26.0–34.0)
MCHC: 31.7 g/dL (ref 30.0–36.0)
MCV: 84.2 fL (ref 78.0–100.0)
Platelets: 281 10*3/uL (ref 150–400)

## 2011-06-16 NOTE — Anesthesia Postprocedure Evaluation (Signed)
  Anesthesia Post Note  Patient: Jenny Porter  Procedure(s) Performed: Procedure(s) (LRB): CESAREAN SECTION (N/A)  Anesthesia type: Spinal  Patient location: Mother/Baby  Post pain: Pain level controlled  Post assessment: Post-op Vital signs reviewed  Last Vitals:  Filed Vitals:   06/16/11 0436  BP: 92/60  Pulse: 80  Temp: 36.7 C  Resp: 16    Post vital signs: Reviewed  Level of consciousness: awake  Complications: No apparent anesthesia complications

## 2011-06-16 NOTE — Addendum Note (Signed)
Addendum  created 06/16/11 1105 by Algis Greenhouse, CRNA   Modules edited:Notes Section

## 2011-06-16 NOTE — Progress Notes (Signed)
Subjective: Postpartum Day 1: Cesarean Delivery Patient reports tolerating PO, + flatus and no problems voiding.    Objective: Vital signs in last 24 hours: Temp:  [97.5 F (36.4 C)-98.8 F (37.1 C)] 98 F (36.7 C) (05/21 0436) Pulse Rate:  [69-100] 80  (05/21 0436) Resp:  [15-24] 16  (05/21 0436) BP: (92-113)/(42-73) 92/60 mmHg (05/21 0436) SpO2:  [96 %-100 %] 96 % (05/21 0436) Weight:  [95.255 kg (210 lb)] 95.255 kg (210 lb) (05/20 1201)  Physical Exam:  General: alert and cooperative Lochia: appropriate Uterine Fundus: firm Incision: healing well DVT Evaluation: No evidence of DVT seen on physical exam.   Basename 06/16/11 0507  HGB 8.8*  HCT 27.8*    Assessment/Plan: Status post Cesarean section. Doing well postoperatively.  Continue current care.  CURTIS,CAROL G 06/16/2011, 7:54 AM

## 2011-06-17 MED ORDER — TRAMADOL HCL 50 MG PO TABS
50.0000 mg | ORAL_TABLET | Freq: Four times a day (QID) | ORAL | Status: DC | PRN
  Administered 2011-06-17 – 2011-06-18 (×4): 50 mg via ORAL
  Filled 2011-06-17 (×5): qty 1

## 2011-06-17 NOTE — Progress Notes (Signed)
Subjective: Postpartum Day 2: Cesarean Delivery Patient reports incisional pain, tolerating PO, + flatus and no problems voiding.  Incisional pain is pin point tenderness R margin of incision, unrelieved by Percocet  Objective: Vital signs in last 24 hours: Temp:  [97.9 F (36.6 C)-98.2 F (36.8 C)] 98.2 F (36.8 C) (05/22 0514) Pulse Rate:  [87-105] 97  (05/22 0514) Resp:  [20] 20  (05/22 0514) BP: (111-113)/(65-79) 113/71 mmHg (05/22 0514) SpO2:  [97 %-99 %] 97 % (05/21 2229)  Physical Exam:  General: alert and cooperative Lochia: appropriate Uterine Fundus: firm Incision: healing well, no evidence of erythema, ecchymosis or seroma DVT Evaluation: No evidence of DVT seen on physical exam.   Basename 06/16/11 0507  HGB 8.8*  HCT 27.8*    Assessment/Plan: Status post Cesarean section. Doing well postoperatively.  Offered K pad and change pain med. Will add Ultram to pain meds options.  Sefora Tietje G 06/17/2011, 7:43 AM

## 2011-06-17 NOTE — Progress Notes (Signed)
KPad to lower abd for right side pain

## 2011-06-18 MED ORDER — TRAMADOL HCL 50 MG PO TABS: 50.0000 mg | ORAL_TABLET | Freq: Four times a day (QID) | ORAL | Status: AC | PRN

## 2011-06-18 MED ORDER — IBUPROFEN 600 MG PO TABS: 600.0000 mg | ORAL_TABLET | Freq: Four times a day (QID) | ORAL | Status: AC

## 2011-06-18 NOTE — Progress Notes (Signed)
Subjective: Postpartum Day 3: Cesarean Delivery Patient reports incisional pain, tolerating PO, + BM and no problems voiding.    Objective: Vital signs in last 24 hours: Temp:  [98 F (36.7 C)-98.5 F (36.9 C)] 98.3 F (36.8 C) (05/23 0509) Pulse Rate:  [92-113] 92  (05/23 0509) Resp:  [18-20] 20  (05/23 0509) BP: (108-136)/(59-80) 136/80 mmHg (05/23 0509)  Physical Exam:  General: alert and cooperative Lochia: appropriate Uterine Fundus: firm Incision: healing well, continues to have pain @ r margin of incision, no evidence of erythema , ecchymosis, or seroma DVT Evaluation: No evidence of DVT seen on physical exam.   Basename 06/16/11 0507  HGB 8.8*  HCT 27.8*    Assessment/Plan: Status post Cesarean section. Postoperative course complicated by incisional pain  Discharge home with standard precautions and return to clinic in 4 days.  Jenny Porter G 06/18/2011, 8:01 AM

## 2011-06-18 NOTE — Discharge Summary (Signed)
Obstetric Discharge Summary Reason for Admission: cesarean section Prenatal Procedures: ultrasound Intrapartum Procedures: cesarean: low cervical, transverse Postpartum Procedures: none Complications-Operative and Postpartum: none Hemoglobin  Date Value Range Status  06/16/2011 8.8* 12.0-15.0 (g/dL) Final     HCT  Date Value Range Status  06/16/2011 27.8* 36.0-46.0 (%) Final    Physical Exam:  General: alert and cooperative Lochia: appropriate Uterine Fundus: firm Incision: healing well DVT Evaluation: No evidence of DVT seen on physical exam.  Discharge Diagnoses: Term Pregnancy-delivered  Discharge Information: Date: 06/18/2011 Activity: pelvic rest Diet: routine Medications: PNV, Ibuprofen and ultram Condition: stable Instructions: refer to practice specific booklet Discharge to: home   Newborn Data:   Kynlea, [place]ston [[id number]]  Live born unspecified sex  Birth Weight:  APGAR: ,    Judyth Demarais, Iyanni Hepp [[id number]]  Live born female  Birth Weight: 7 lb 13.4 oz (3555 g) APGAR: 8, 9  Home with mother.  Yobany Vroom G 06/18/2011, 8:12 AM

## 2011-07-17 ENCOUNTER — Ambulatory Visit (HOSPITAL_COMMUNITY): Admission: RE | Admit: 2011-07-17 | Payer: 59 | Source: Ambulatory Visit

## 2011-10-09 ENCOUNTER — Other Ambulatory Visit: Payer: Self-pay | Admitting: Podiatry

## 2011-10-09 DIAGNOSIS — M25571 Pain in right ankle and joints of right foot: Secondary | ICD-10-CM

## 2011-10-14 ENCOUNTER — Inpatient Hospital Stay (HOSPITAL_COMMUNITY): Admission: RE | Admit: 2011-10-14 | Payer: 59 | Source: Ambulatory Visit

## 2011-10-14 ENCOUNTER — Ambulatory Visit (HOSPITAL_COMMUNITY)
Admission: RE | Admit: 2011-10-14 | Discharge: 2011-10-14 | Disposition: A | Discharge status: Home / Self Care | Payer: 59 | Source: Ambulatory Visit | Attending: Podiatry | Admitting: Podiatry

## 2011-10-14 DIAGNOSIS — M65979 Unspecified synovitis and tenosynovitis, unspecified ankle and foot: Secondary | ICD-10-CM | POA: Insufficient documentation

## 2011-10-14 DIAGNOSIS — M659 Synovitis and tenosynovitis, unspecified: Secondary | ICD-10-CM | POA: Insufficient documentation

## 2011-10-14 DIAGNOSIS — M25579 Pain in unspecified ankle and joints of unspecified foot: Secondary | ICD-10-CM | POA: Insufficient documentation

## 2011-10-14 DIAGNOSIS — M25571 Pain in right ankle and joints of right foot: Secondary | ICD-10-CM

## 2013-07-24 ENCOUNTER — Encounter (HOSPITAL_COMMUNITY): Payer: Self-pay | Admitting: Pharmacy Technician

## 2013-07-25 ENCOUNTER — Encounter (HOSPITAL_COMMUNITY)
Admission: RE | Admit: 2013-07-25 | Discharge: 2013-07-25 | Disposition: A | Discharge status: Home / Self Care | Payer: 59 | Source: Ambulatory Visit | Attending: Obstetrics and Gynecology | Admitting: Obstetrics and Gynecology

## 2013-07-25 ENCOUNTER — Encounter (HOSPITAL_COMMUNITY): Payer: Self-pay

## 2013-07-25 DIAGNOSIS — Z01812 Encounter for preprocedural laboratory examination: Secondary | ICD-10-CM | POA: Insufficient documentation

## 2013-07-25 HISTORY — DX: Anemia, unspecified: D64.9

## 2013-07-25 LAB — TYPE AND SCREEN
ABO/RH(D): O POS
Antibody Screen: NEGATIVE

## 2013-07-25 LAB — CBC
HEMATOCRIT: 30.2 % — AB (ref 36.0–46.0)
HEMOGLOBIN: 9.4 g/dL — AB (ref 12.0–15.0)
MCH: 24.2 pg — AB (ref 26.0–34.0)
MCHC: 31.1 g/dL (ref 30.0–36.0)
MCV: 77.6 fL — ABNORMAL LOW (ref 78.0–100.0)
Platelets: 358 10*3/uL (ref 150–400)
RBC: 3.89 MIL/uL (ref 3.87–5.11)
RDW: 15.3 % (ref 11.5–15.5)
WBC: 9.3 10*3/uL (ref 4.0–10.5)

## 2013-07-25 LAB — RPR

## 2013-07-25 NOTE — Patient Instructions (Signed)
Your procedure is scheduled on:08/05/13  Enter through the Main Entrance at :0730 am Pick up desk phone and dial [phone] and inform us of your arrival.  Please call [phone] if you have any problems the morning of surgery.  Remember: Do not eat food or drink liquids, including water, after midnight:Friday   You may brush your teeth the morning of surgery.  Take these meds the morning of surgery with a sip of water:none  DO NOT wear jewelry, eye make-up, lipstick,body lotion, or dark fingernail polish.  (Polished toes are ok) You may wear deodorant.  If you are to be admitted after surgery, leave suitcase in car until your room has been assigned.

## 2013-07-25 NOTE — H&P (Addendum)
Jenny Porter is a 33 y.o. female presenting for repeat c-section and sterilization . History OB History   Grav Para Term Preterm Abortions TAB SAB Ect Mult Living   4 2 2  0 1 0 1 0 1 2     Past Medical History  Diagnosis Date  . Asthma     rare inhaler use  . Heartburn in pregnancy   . No pertinent past medical history   . Anemia    Past Surgical History  Procedure Laterality Date  . Dilation and curettage of uterus    . Cesarean section    . Cesarean section  06/15/2011    Procedure: CESAREAN SECTION;  Surgeon: Zelphia CairoGretchen Adkins, MD;  Location: [facility];  Service: Gynecology;  Laterality: N/A;  REPEAT EDC 5/25   Family History: family history includes Heart disease in her paternal grandmother. There is no history of Anesthesia problems, Hypotension, Pseudochol deficiency, or Malignant hyperthermia. Social History:  reports that she has never smoked. She does not have any smokeless tobacco history on file. She reports that she does not drink alcohol or use illicit drugs.   Prenatal Transfer Tool  Maternal Diabetes: No Genetic Screening: Normal Maternal Ultrasounds/Referrals: Normal Fetal Ultrasounds or other Referrals:  None Maternal Substance Abuse:  No Significant Maternal Medications:  None Significant Maternal Lab Results:  None Other Comments:  None  ROS - neg    There were no vitals taken for this visit. Exam Physical Exam  Gen - NAD Abd - gravid, NT Ext - NT PV - cvx closed Prenatal labs: ABO, Rh: --/--/O POS (06/30 1125) Antibody: NEG (06/30 1125) Rubella:   RPR:    HBsAg:    HIV:    GBS:   pt declines  Assessment/Plan: Admit Repeat c-section Pt desires sterilization r/b/a discussed, questions answered, informed consent   ADKINS,GRETCHEN 07/25/2013, 2:34 PM

## 2013-07-25 NOTE — Pre-Procedure Instructions (Signed)
Jenny Porter in office notified of pt's hgb and hct results and PPH score of 23. Jenny SetaHeather will make Dr. Renaldo FiddlerAdkins aware.

## 2013-08-05 ENCOUNTER — Inpatient Hospital Stay (HOSPITAL_COMMUNITY)
Admission: RE | Admit: 2013-08-05 | Discharge: 2013-08-08 | DRG: 765 | Disposition: A | Discharge status: Home / Self Care | Payer: 59 | Source: Ambulatory Visit | Attending: Obstetrics and Gynecology | Admitting: Obstetrics and Gynecology

## 2013-08-05 ENCOUNTER — Inpatient Hospital Stay (HOSPITAL_COMMUNITY): Payer: 59

## 2013-08-05 ENCOUNTER — Encounter (HOSPITAL_COMMUNITY): Payer: Self-pay | Admitting: *Deleted

## 2013-08-05 ENCOUNTER — Encounter (HOSPITAL_COMMUNITY)
Admission: RE | Disposition: A | Discharge status: Home / Self Care | Payer: Self-pay | Source: Ambulatory Visit | Attending: Obstetrics and Gynecology

## 2013-08-05 ENCOUNTER — Encounter (HOSPITAL_COMMUNITY): Payer: 59

## 2013-08-05 DIAGNOSIS — E669 Obesity, unspecified: Secondary | ICD-10-CM | POA: Diagnosis present

## 2013-08-05 DIAGNOSIS — O34219 Maternal care for unspecified type scar from previous cesarean delivery: Principal | ICD-10-CM | POA: Diagnosis present

## 2013-08-05 DIAGNOSIS — Z302 Encounter for sterilization: Secondary | ICD-10-CM

## 2013-08-05 DIAGNOSIS — Z6841 Body Mass Index (BMI) 40.0 and over, adult: Secondary | ICD-10-CM

## 2013-08-05 DIAGNOSIS — R12 Heartburn: Secondary | ICD-10-CM | POA: Diagnosis present

## 2013-08-05 DIAGNOSIS — O99214 Obesity complicating childbirth: Secondary | ICD-10-CM

## 2013-08-05 DIAGNOSIS — O9902 Anemia complicating childbirth: Secondary | ICD-10-CM | POA: Diagnosis present

## 2013-08-05 DIAGNOSIS — Z98891 History of uterine scar from previous surgery: Secondary | ICD-10-CM

## 2013-08-05 DIAGNOSIS — D649 Anemia, unspecified: Secondary | ICD-10-CM | POA: Diagnosis present

## 2013-08-05 DIAGNOSIS — J45909 Unspecified asthma, uncomplicated: Secondary | ICD-10-CM | POA: Diagnosis present

## 2013-08-05 LAB — CBC
HCT: 31 % — ABNORMAL LOW (ref 36.0–46.0)
HEMOGLOBIN: 9.6 g/dL — AB (ref 12.0–15.0)
MCH: 23.6 pg — ABNORMAL LOW (ref 26.0–34.0)
MCHC: 31 g/dL (ref 30.0–36.0)
MCV: 76.2 fL — ABNORMAL LOW (ref 78.0–100.0)
Platelets: 348 10*3/uL (ref 150–400)
RBC: 4.07 MIL/uL (ref 3.87–5.11)
RDW: 15.4 % (ref 11.5–15.5)
WBC: 8.9 10*3/uL (ref 4.0–10.5)

## 2013-08-05 LAB — PREPARE RBC (CROSSMATCH)

## 2013-08-05 LAB — RPR

## 2013-08-05 SURGERY — Surgical Case
Anesthesia: Spinal

## 2013-08-05 MED ORDER — SCOPOLAMINE 1 MG/3DAYS TD PT72
1.0000 | MEDICATED_PATCH | Freq: Once | TRANSDERMAL | Status: DC
  Filled 2013-08-05: qty 1

## 2013-08-05 MED ORDER — WITCH HAZEL-GLYCERIN EX PADS: 1.0000 "application " | MEDICATED_PAD | CUTANEOUS | Status: DC | PRN

## 2013-08-05 MED ORDER — PHENYLEPHRINE 8 MG IN D5W 100 ML (0.08MG/ML) PREMIX OPTIME
INJECTION | INTRAVENOUS | Status: DC | PRN
  Administered 2013-08-05: 60 ug/min via INTRAVENOUS

## 2013-08-05 MED ORDER — TETANUS-DIPHTH-ACELL PERTUSSIS 5-2.5-18.5 LF-MCG/0.5 IM SUSP: 0.5000 mL | Freq: Once | INTRAMUSCULAR | Status: DC

## 2013-08-05 MED ORDER — DEXTROSE 5 % IV SOLN
1.0000 ug/kg/h | INTRAVENOUS | Status: DC | PRN
  Filled 2013-08-05: qty 2

## 2013-08-05 MED ORDER — CEFAZOLIN SODIUM-DEXTROSE 2-3 GM-% IV SOLR
2.0000 g | INTRAVENOUS | Status: AC
  Administered 2013-08-05: 2 g via INTRAVENOUS

## 2013-08-05 MED ORDER — OXYTOCIN 10 UNIT/ML IJ SOLN
INTRAMUSCULAR | Status: AC
  Filled 2013-08-05: qty 4

## 2013-08-05 MED ORDER — 0.9 % SODIUM CHLORIDE (POUR BTL) OPTIME
TOPICAL | Status: DC | PRN
  Administered 2013-08-05: 1000 mL

## 2013-08-05 MED ORDER — KETOROLAC TROMETHAMINE 60 MG/2ML IM SOLN
60.0000 mg | Freq: Once | INTRAMUSCULAR | Status: AC | PRN
  Administered 2013-08-05: 60 mg via INTRAMUSCULAR

## 2013-08-05 MED ORDER — DIBUCAINE 1 % RE OINT: 1.0000 "application " | TOPICAL_OINTMENT | RECTAL | Status: DC | PRN

## 2013-08-05 MED ORDER — HYDROMORPHONE HCL PF 1 MG/ML IJ SOLN
0.2500 mg | INTRAMUSCULAR | Status: DC | PRN
Start: 2013-08-05 — End: 2013-08-05

## 2013-08-05 MED ORDER — NALOXONE HCL 0.4 MG/ML IJ SOLN
0.4000 mg | INTRAMUSCULAR | Status: DC | PRN
Start: 2013-08-05 — End: 2013-08-08

## 2013-08-05 MED ORDER — SIMETHICONE 80 MG PO CHEW: 80.0000 mg | CHEWABLE_TABLET | ORAL | Status: DC | PRN

## 2013-08-05 MED ORDER — NALBUPHINE HCL 10 MG/ML IJ SOLN
5.0000 mg | INTRAMUSCULAR | Status: DC | PRN
Start: 2013-08-05 — End: 2013-08-08

## 2013-08-05 MED ORDER — DIPHENHYDRAMINE HCL 25 MG PO CAPS: 25.0000 mg | ORAL_CAPSULE | ORAL | Status: DC | PRN

## 2013-08-05 MED ORDER — ACETAMINOPHEN 500 MG PO TABS
1000.0000 mg | ORAL_TABLET | Freq: Four times a day (QID) | ORAL | Status: DC | PRN
  Administered 2013-08-05 – 2013-08-07 (×5): 1000 mg via ORAL
  Filled 2013-08-05 (×5): qty 2

## 2013-08-05 MED ORDER — NALBUPHINE HCL 10 MG/ML IJ SOLN: 5.0000 mg | INTRAMUSCULAR | Status: DC | PRN

## 2013-08-05 MED ORDER — MENTHOL 3 MG MT LOZG: 1.0000 | LOZENGE | OROMUCOSAL | Status: DC | PRN

## 2013-08-05 MED ORDER — ONDANSETRON HCL 4 MG/2ML IJ SOLN: 4.0000 mg | INTRAMUSCULAR | Status: DC | PRN

## 2013-08-05 MED ORDER — LACTATED RINGERS IV SOLN
Freq: Once | INTRAVENOUS | Status: AC
  Administered 2013-08-05 (×3): via INTRAVENOUS

## 2013-08-05 MED ORDER — ONDANSETRON HCL 4 MG PO TABS: 4.0000 mg | ORAL_TABLET | ORAL | Status: DC | PRN

## 2013-08-05 MED ORDER — METOCLOPRAMIDE HCL 5 MG/ML IJ SOLN: 10.0000 mg | Freq: Three times a day (TID) | INTRAMUSCULAR | Status: DC | PRN

## 2013-08-05 MED ORDER — DIPHENHYDRAMINE HCL 50 MG/ML IJ SOLN
12.5000 mg | INTRAMUSCULAR | Status: DC | PRN
Start: 2013-08-05 — End: 2013-08-08

## 2013-08-05 MED ORDER — MEDROXYPROGESTERONE ACETATE 150 MG/ML IM SUSP: 150.0000 mg | INTRAMUSCULAR | Status: DC | PRN

## 2013-08-05 MED ORDER — MEPERIDINE HCL 25 MG/ML IJ SOLN
6.2500 mg | INTRAMUSCULAR | Status: DC | PRN
Start: 2013-08-05 — End: 2013-08-05

## 2013-08-05 MED ORDER — SCOPOLAMINE 1 MG/3DAYS TD PT72
1.0000 | MEDICATED_PATCH | Freq: Once | TRANSDERMAL | Status: DC
  Administered 2013-08-05: 1.5 mg via TRANSDERMAL

## 2013-08-05 MED ORDER — PRENATAL MULTIVITAMIN CH
1.0000 | ORAL_TABLET | Freq: Every day | ORAL | Status: DC
  Administered 2013-08-06 – 2013-08-08 (×3): 1 via ORAL
  Filled 2013-08-05 (×3): qty 1

## 2013-08-05 MED ORDER — OXYTOCIN 40 UNITS IN LACTATED RINGERS INFUSION - SIMPLE MED: 62.5000 mL/h | INTRAVENOUS | Status: AC

## 2013-08-05 MED ORDER — LANOLIN HYDROUS EX OINT: 1.0000 "application " | TOPICAL_OINTMENT | CUTANEOUS | Status: DC | PRN

## 2013-08-05 MED ORDER — KETOROLAC TROMETHAMINE 30 MG/ML IJ SOLN: 30.0000 mg | Freq: Four times a day (QID) | INTRAMUSCULAR | Status: AC | PRN

## 2013-08-05 MED ORDER — OXYTOCIN 10 UNIT/ML IJ SOLN
40.0000 [IU] | INTRAVENOUS | Status: DC | PRN
  Administered 2013-08-05: 40 [IU] via INTRAVENOUS

## 2013-08-05 MED ORDER — PROMETHAZINE HCL 25 MG/ML IJ SOLN: 6.2500 mg | INTRAMUSCULAR | Status: DC | PRN

## 2013-08-05 MED ORDER — DEXTROSE IN LACTATED RINGERS 5 % IV SOLN
INTRAVENOUS | Status: DC
  Administered 2013-08-05 – 2013-08-06 (×2): via INTRAVENOUS

## 2013-08-05 MED ORDER — SENNOSIDES-DOCUSATE SODIUM 8.6-50 MG PO TABS
2.0000 | ORAL_TABLET | ORAL | Status: DC
  Administered 2013-08-06 – 2013-08-08 (×2): 2 via ORAL
  Filled 2013-08-05 (×2): qty 2

## 2013-08-05 MED ORDER — OXYCODONE-ACETAMINOPHEN 5-325 MG PO TABS
1.0000 | ORAL_TABLET | ORAL | Status: DC | PRN
  Administered 2013-08-06 – 2013-08-08 (×6): 1 via ORAL
  Filled 2013-08-05 (×6): qty 1

## 2013-08-05 MED ORDER — ONDANSETRON HCL 4 MG/2ML IJ SOLN: 4.0000 mg | Freq: Three times a day (TID) | INTRAMUSCULAR | Status: DC | PRN

## 2013-08-05 MED ORDER — KETOROLAC TROMETHAMINE 60 MG/2ML IM SOLN
INTRAMUSCULAR | Status: AC
  Administered 2013-08-05: 60 mg via INTRAMUSCULAR
  Filled 2013-08-05: qty 2

## 2013-08-05 MED ORDER — ONDANSETRON HCL 4 MG/2ML IJ SOLN
INTRAMUSCULAR | Status: DC | PRN
  Administered 2013-08-05: 4 mg via INTRAVENOUS

## 2013-08-05 MED ORDER — MORPHINE SULFATE (PF) 0.5 MG/ML IJ SOLN
INTRAMUSCULAR | Status: DC | PRN
  Administered 2013-08-05: 150 ug via INTRATHECAL

## 2013-08-05 MED ORDER — SODIUM CHLORIDE 0.9 % IJ SOLN: 3.0000 mL | INTRAMUSCULAR | Status: DC | PRN

## 2013-08-05 MED ORDER — FENTANYL CITRATE 0.05 MG/ML IJ SOLN
INTRAMUSCULAR | Status: AC
  Filled 2013-08-05: qty 2

## 2013-08-05 MED ORDER — PHENYLEPHRINE 8 MG IN D5W 100 ML (0.08MG/ML) PREMIX OPTIME
INJECTION | INTRAVENOUS | Status: AC
  Filled 2013-08-05: qty 100

## 2013-08-05 MED ORDER — KETOROLAC TROMETHAMINE 30 MG/ML IJ SOLN
30.0000 mg | Freq: Four times a day (QID) | INTRAMUSCULAR | Status: AC | PRN
  Administered 2013-08-05: 30 mg via INTRAVENOUS
  Filled 2013-08-05: qty 1

## 2013-08-05 MED ORDER — MEPERIDINE HCL 25 MG/ML IJ SOLN: 6.2500 mg | INTRAMUSCULAR | Status: DC | PRN

## 2013-08-05 MED ORDER — MORPHINE SULFATE 0.5 MG/ML IJ SOLN
INTRAMUSCULAR | Status: AC
  Filled 2013-08-05: qty 10

## 2013-08-05 MED ORDER — SIMETHICONE 80 MG PO CHEW
80.0000 mg | CHEWABLE_TABLET | Freq: Three times a day (TID) | ORAL | Status: DC
  Administered 2013-08-06 – 2013-08-08 (×7): 80 mg via ORAL
  Filled 2013-08-05 (×7): qty 1

## 2013-08-05 MED ORDER — SCOPOLAMINE 1 MG/3DAYS TD PT72
MEDICATED_PATCH | TRANSDERMAL | Status: AC
  Administered 2013-08-05: 1.5 mg via TRANSDERMAL
  Filled 2013-08-05: qty 1

## 2013-08-05 MED ORDER — CEFAZOLIN SODIUM-DEXTROSE 2-3 GM-% IV SOLR
INTRAVENOUS | Status: AC
  Filled 2013-08-05: qty 50

## 2013-08-05 MED ORDER — IBUPROFEN 600 MG PO TABS
600.0000 mg | ORAL_TABLET | Freq: Four times a day (QID) | ORAL | Status: DC
  Administered 2013-08-06 – 2013-08-08 (×11): 600 mg via ORAL
  Filled 2013-08-05 (×11): qty 1

## 2013-08-05 MED ORDER — LACTATED RINGERS IV SOLN
INTRAVENOUS | Status: DC
  Administered 2013-08-05: 125 mL/h via INTRAVENOUS
  Administered 2013-08-05: 10:00:00 via INTRAVENOUS

## 2013-08-05 MED ORDER — SIMETHICONE 80 MG PO CHEW
80.0000 mg | CHEWABLE_TABLET | ORAL | Status: DC
  Administered 2013-08-06 – 2013-08-08 (×2): 80 mg via ORAL
  Filled 2013-08-05 (×2): qty 1

## 2013-08-05 MED ORDER — FENTANYL CITRATE 0.05 MG/ML IJ SOLN
INTRAMUSCULAR | Status: DC | PRN
  Administered 2013-08-05: 15 ug via INTRATHECAL

## 2013-08-05 MED ORDER — MEASLES, MUMPS & RUBELLA VAC ~~LOC~~ INJ
0.5000 mL | INJECTION | Freq: Once | SUBCUTANEOUS | Status: DC
  Filled 2013-08-05: qty 0.5

## 2013-08-05 MED ORDER — DIPHENHYDRAMINE HCL 25 MG PO CAPS: 25.0000 mg | ORAL_CAPSULE | Freq: Four times a day (QID) | ORAL | Status: DC | PRN

## 2013-08-05 MED ORDER — ONDANSETRON HCL 4 MG/2ML IJ SOLN
INTRAMUSCULAR | Status: AC
  Filled 2013-08-05: qty 2

## 2013-08-05 MED ORDER — BUPIVACAINE IN DEXTROSE 0.75-8.25 % IT SOLN
INTRATHECAL | Status: DC | PRN
  Administered 2013-08-05: 2 mL via INTRATHECAL

## 2013-08-05 MED ORDER — DIPHENHYDRAMINE HCL 50 MG/ML IJ SOLN: 25.0000 mg | INTRAMUSCULAR | Status: DC | PRN

## 2013-08-05 SURGICAL SUPPLY — 39 items
BENZOIN TINCTURE PRP APPL 2/3 (GAUZE/BANDAGES/DRESSINGS) ×3 IMPLANT
CLAMP CORD UMBIL (MISCELLANEOUS) IMPLANT
CLOSURE WOUND 1/2 X4 (GAUZE/BANDAGES/DRESSINGS) ×1
CLOTH BEACON ORANGE TIMEOUT ST (SAFETY) ×3 IMPLANT
DERMABOND ADVANCED (GAUZE/BANDAGES/DRESSINGS) ×2
DERMABOND ADVANCED .7 DNX12 (GAUZE/BANDAGES/DRESSINGS) ×1 IMPLANT
DRAPE LG THREE QUARTER DISP (DRAPES) IMPLANT
DRSG OPSITE POSTOP 4X10 (GAUZE/BANDAGES/DRESSINGS) ×3 IMPLANT
DURAPREP 26ML APPLICATOR (WOUND CARE) ×3 IMPLANT
ELECT REM PT RETURN 9FT ADLT (ELECTROSURGICAL) ×3
ELECTRODE REM PT RTRN 9FT ADLT (ELECTROSURGICAL) ×1 IMPLANT
EXTRACTOR VACUUM M CUP 4 TUBE (SUCTIONS) IMPLANT
EXTRACTOR VACUUM M CUP 4' TUBE (SUCTIONS)
GLOVE BIO SURGEON STRL SZ 6.5 (GLOVE) ×2 IMPLANT
GLOVE BIO SURGEONS STRL SZ 6.5 (GLOVE) ×1
GLOVE BIOGEL PI IND STRL 7.0 (GLOVE) ×1 IMPLANT
GLOVE BIOGEL PI INDICATOR 7.0 (GLOVE) ×2
GOWN STRL REUS W/TWL LRG LVL3 (GOWN DISPOSABLE) ×6 IMPLANT
KIT ABG SYR 3ML LUER SLIP (SYRINGE) IMPLANT
NEEDLE HYPO 25X5/8 SAFETYGLIDE (NEEDLE) IMPLANT
NS IRRIG 1000ML POUR BTL (IV SOLUTION) ×3 IMPLANT
PACK C SECTION WH (CUSTOM PROCEDURE TRAY) ×3 IMPLANT
PAD ABD 8X7 1/2 STERILE (GAUZE/BANDAGES/DRESSINGS) ×3 IMPLANT
PAD OB MATERNITY 4.3X12.25 (PERSONAL CARE ITEMS) ×3 IMPLANT
SPONGE GAUZE 4X4 12PLY STER LF (GAUZE/BANDAGES/DRESSINGS) ×3 IMPLANT
STAPLER VISISTAT 35W (STAPLE) IMPLANT
STRIP CLOSURE SKIN 1/2X4 (GAUZE/BANDAGES/DRESSINGS) ×2 IMPLANT
SUT CHROMIC 0 CT 802H (SUTURE) ×12 IMPLANT
SUT CHROMIC 0 CTX 36 (SUTURE) ×9 IMPLANT
SUT GUT PLAIN 0 CT-3 TAN 27 (SUTURE) ×3 IMPLANT
SUT MON AB-0 CT1 36 (SUTURE) ×3 IMPLANT
SUT PDS AB 0 CTX 60 (SUTURE) ×3 IMPLANT
SUT PLAIN 0 NONE (SUTURE) ×6 IMPLANT
SUT PLAIN 2 0 XLH (SUTURE) ×3 IMPLANT
SUT VIC AB 4-0 KS 27 (SUTURE) ×3 IMPLANT
TAPE CLOTH SURG 4X10 WHT LF (GAUZE/BANDAGES/DRESSINGS) ×3 IMPLANT
TOWEL OR 17X24 6PK STRL BLUE (TOWEL DISPOSABLE) ×3 IMPLANT
TRAY FOLEY CATH 14FR (SET/KITS/TRAYS/PACK) ×3 IMPLANT
WATER STERILE IRR 1000ML POUR (IV SOLUTION) ×3 IMPLANT

## 2013-08-05 NOTE — Transfer of Care (Signed)
Immediate Anesthesia Transfer of Care Note  Patient: Jenny Porter  Procedure(s) Performed: Procedure(s): REPEAT CESAREAN SECTION WITH BILATERAL TUBAL LIGATION (N/A)  Patient Location: PACU  Anesthesia Type:Spinal  Level of Consciousness: awake, alert  and oriented  Airway & Oxygen Therapy: Patient Spontanous Breathing  Post-op Assessment: Report given to PACU RN and Post -op Vital signs reviewed and stable  Post vital signs: Reviewed and stable  Complications: No apparent anesthesia complications

## 2013-08-05 NOTE — Anesthesia Postprocedure Evaluation (Signed)
Anesthesia Post Note  Patient: Jenny Porter  Procedure(s) Performed: Procedure(s) (LRB): REPEAT CESAREAN SECTION WITH BILATERAL TUBAL LIGATION (N/A)  Anesthesia type: Spinal  Patient location: PACU  Post pain: Pain level controlled  Post assessment: Post-op Vital signs reviewed  Last Vitals: BP 98/55  Pulse 66  Temp(Src) 36.7 C (Oral)  Resp 18  Ht 5\' 2"  (1.575 m)  Wt 217 lb (98.431 kg)  BMI 39.68 kg/m2  SpO2 96%  LMP 11/05/2012  Breastfeeding? Yes  Post vital signs: Reviewed  Level of consciousness: sedated  Complications: No apparent anesthesia complications

## 2013-08-05 NOTE — Lactation Note (Signed)
This note was copied from the chart of Jenny Billye Philip AspenHernandez Porter. Lactation Consultation Note Initial visit at 13 hours of age.  Mom reports feedings are going well.  Baby has been on the breast for almost 30 minutes at this visit.  Encouraged deep latch with chin, nose and cheeks touching breast.  Baby pulled off and nipple was slightly compressed.  Baby is able to get a deep latch with wide flanged lips, encouraged pillow support.  [facility] given and discussed.  Encouraged to feed with early cues on demand.  Early newborn behavior discussed.  Hand expression demonstrated with colostrum visible.  Mom to call for assist as needed.    Patient Name: Jenny Porter Today's Date: 08/05/2013 Reason for consult: Initial assessment   Maternal Data Has patient been taught Hand Expression?: Yes Does the patient have breastfeeding experience prior to this delivery?: Yes  Feeding Feeding Type: Breast Fed Length of feed: 35 min  LATCH Score/Interventions Latch: Grasps breast easily, tongue down, lips flanged, rhythmical sucking.  Audible Swallowing: A few with stimulation Intervention(s): Hand expression;Skin to skin Intervention(s): Skin to skin  Type of Nipple: Everted at rest and after stimulation  Comfort (Breast/Nipple): Soft / non-tender     Hold (Positioning): Assistance needed to correctly position infant at breast and maintain latch. Intervention(s): Breastfeeding basics reviewed;Support Pillows;Position options;Skin to skin  LATCH Score: 8  Lactation Tools Discussed/Used     Consult Status Consult Status: Follow-up Date: 08/06/13 Follow-up type: In-patient    Jenny Porter, Jenny Porter 08/05/2013, 10:51 PM

## 2013-08-05 NOTE — Anesthesia Procedure Notes (Signed)
Spinal  Patient location during procedure: OR Staffing Anesthesiologist: Nolon Nations R Performed by: anesthesiologist  Preanesthetic Checklist Completed: patient identified, site marked, surgical consent, pre-op evaluation, timeout performed, IV checked, risks and benefits discussed and monitors and equipment checked Spinal Block Patient position: sitting Prep: site prepped and draped and DuraPrep Patient monitoring: heart rate, continuous pulse ox and blood pressure Approach: midline Location: L3-4 Injection technique: single-shot Needle Needle type: Sprotte  Needle gauge: 24 G Needle length: 9 cm Assessment Sensory level: T6 Additional Notes Expiration date of kit checked and confirmed. Patient tolerated procedure well, without complications.

## 2013-08-05 NOTE — Op Note (Signed)
Cesarean Section Procedure Note   Jenny Porter  08/05/2013  Indications: Scheduled Proceedure/Maternal Request , desires sterilization  Pre-operative Diagnosis: Desires sterilzation, Repeat cesarean section.   Post-operative Diagnosis: Same   Surgeon: Surgeon(s) and Role:    * Zelphia CairoGretchen Livan Hires, MD - Primary   Assistants: none  Anesthesia: spinal   Procedure Details:  The patient was seen in the Holding Room. The risks, benefits, complications, treatment options, and expected outcomes were discussed with the patient. The patient concurred with the proposed plan, giving informed consent. identified as Jenny Porter and the procedure verified as C-Section Delivery. A Time Out was held and the above information confirmed.  After induction of anesthesia, the patient was draped and prepped in the usual sterile manner. A transverse was made and carried down through the subcutaneous tissue to the fascia. Fascial incision was made and extended transversely. The fascia was separated from the underlying rectus tissue superiorly and inferiorly. The peritoneum was identified and entered. Peritoneal incision was extended longitudinally. The utero-vesical peritoneal reflection was incised transversely and the bladder flap was bluntly freed from the lower uterine segment. A low transverse uterine incision was made. Delivered from cephalic presentation was a vigorous female with Apgar scores of 9 at one minute and 9 at five minutes. Cord ph was not sent the umbilical cord was clamped and cut cord blood was obtained for evaluation. The placenta was removed Intact and appeared normal. The uterine outline, tubes and ovaries appeared normal}. The uterine incision was closed with running locked sutures of 0chromic gut.   Hemostasis was observed. Lavage was carried out until clear.  A knuckle of fallopian tube was then doubly tied with plain gut suture.  The knuckle was excised with sissors and  passed for pathology.  Hemostasis was observed and the procedure was repeated on the opposite fallopian tube.  The peritoneum was closed with 0 monocryl. The fascia was then reapproximated with running sutures of 0PDS. The subcuticular closure was performed using 2-0plain gut. The skin was closed with 4-0Vicryl.   Instrument, sponge, and needle counts were correct prior the abdominal closure and were correct at the conclusion of the case.    Findings:   Estimated Blood Loss: * No blood loss amount entered *   Urine Output: clear  Specimens: segment of bilateral fallopian tubes  Complications: no complications  Disposition: PACU - hemodynamically stable.   Maternal Condition: stable   Baby condition / location:  Couplet care / Skin to Skin  Attending Attestation: I was present and scrubbed for the entire procedure.   Signed: Surgeon(s): Zelphia CairoGretchen Reiss Mowrey, MD

## 2013-08-05 NOTE — Anesthesia Preprocedure Evaluation (Signed)
Anesthesia Evaluation  Patient identified by MRN, date of birth, ID band Patient awake    Reviewed: Allergy & Precautions, H&P , NPO status , Patient's Chart, lab work & pertinent test results, reviewed documented beta blocker date and time   History of Anesthesia Complications Negative for: history of anesthetic complications  Airway Mallampati: I TM Distance: >3 FB Neck ROM: full    Dental  (+) Teeth Intact   Pulmonary asthma (no recent inhaler use) ,  breath sounds clear to auscultation        Cardiovascular negative cardio ROS  Rhythm:regular Rate:Normal     Neuro/Psych PSYCHIATRIC DISORDERS negative neurological ROS     GI/Hepatic Neg liver ROS, GERD- (tums)  Medicated,  Endo/Other  Morbid obesity  Renal/GU negative Renal ROS     Musculoskeletal   Abdominal   Peds  Hematology  (+) anemia ,   Anesthesia Other Findings   Reproductive/Obstetrics (+) Pregnancy (h/o c/s x1)                           Anesthesia Physical  Anesthesia Plan  ASA: III  Anesthesia Plan: Spinal   Post-op Pain Management:    Induction:   Airway Management Planned:   Additional Equipment:   Intra-op Plan:   Post-operative Plan:   Informed Consent: I have reviewed the patients History and Physical, chart, labs and discussed the procedure including the risks, benefits and alternatives for the proposed anesthesia with the patient or authorized representative who has indicated his/her understanding and acceptance.     Plan Discussed with: CRNA  Anesthesia Plan Comments:         Anesthesia Quick Evaluation

## 2013-08-06 LAB — CBC
HCT: 23.2 % — ABNORMAL LOW (ref 36.0–46.0)
Hemoglobin: 7.1 g/dL — ABNORMAL LOW (ref 12.0–15.0)
MCH: 23.7 pg — ABNORMAL LOW (ref 26.0–34.0)
MCHC: 30.6 g/dL (ref 30.0–36.0)
MCV: 77.3 fL — ABNORMAL LOW (ref 78.0–100.0)
PLATELETS: 269 10*3/uL (ref 150–400)
RBC: 3 MIL/uL — AB (ref 3.87–5.11)
RDW: 15.7 % — ABNORMAL HIGH (ref 11.5–15.5)
WBC: 9.5 10*3/uL (ref 4.0–10.5)

## 2013-08-06 NOTE — Lactation Note (Signed)
This note was copied from the chart of Jenny Hally Philip AspenHernandez Porter. Lactation Consultation Note Follow up visit at 33 hours of age.  Mom reports baby has been feeding frequently and she is a little sore.  She reports baby does not open her mouth wide.  Gloved finger assessment with baby who sucks well, but noted to have a prominent ridge midline on upper gums that is a little sharp feeling, questionable tight labial frenulum.  Baby extends tongue well and uses appropriately.  Mom instructed on cross cradle hold with minimal assistance needed to latch baby.  Encouraged mom to do TMJ massages to help relax jaws and to attempt a semi sitting football hold to maybe relax jaw.  Mom reports pain only with latching and improved now. Mom does have bilateral bruising with a position stripe on right nipple.  Encouraged to use EBM on nipples.  Mom to call for assist as needed.     Patient Name: Jenny Porter ZOXWR'UToday's Date: 08/06/2013 Reason for consult: Follow-up assessment   Maternal Data Does the patient have breastfeeding experience prior to this delivery?: Yes  Feeding Feeding Type: Breast Fed Length of feed:  (observed 5 minutes)  LATCH Score/Interventions Latch: Grasps breast easily, tongue down, lips flanged, rhythmical sucking.  Audible Swallowing: A few with stimulation Intervention(s): Skin to skin;Hand expression  Type of Nipple: Everted at rest and after stimulation  Comfort (Breast/Nipple): Filling, red/small blisters or bruises, mild/mod discomfort Problem noted: Cracked, bleeding, blisters, bruises  Problem noted: Mild/Moderate discomfort  Hold (Positioning): Assistance needed to correctly position infant at breast and maintain latch. Intervention(s): Breastfeeding basics reviewed;Support Pillows;Position options;Skin to skin  LATCH Score: 7  Lactation Tools Discussed/Used     Consult Status Consult Status: Follow-up Date: 08/07/13 Follow-up type:  In-patient    Jannifer RodneyShoptaw, Deloria Brassfield Lynn 08/06/2013, 7:07 PM

## 2013-08-06 NOTE — Addendum Note (Signed)
Addendum created 08/06/13 0914 by Shanon PayorSuzanne M Tc Kapusta, CRNA   Modules edited: Notes Section   Notes Section:  File: [id number]

## 2013-08-06 NOTE — Progress Notes (Signed)
Subjective: Postpartum Day 2: Cesarean Delivery Patient reports incisional pain, tolerating PO and no problems voiding.   Working with nursing and lactation support to help with breastfeeding difficulties  Objective: Vital signs in last 24 hours: Temp:  [97.8 F (36.6 C)-98.9 F (37.2 C)] 98 F (36.7 C) (07/12 0542) Pulse Rate:  [59-91] 70 (07/12 0542) Resp:  [16-20] 18 (07/12 0542) BP: (81-110)/(39-57) 98/52 mmHg (07/12 0542) SpO2:  [95 %-100 %] 95 % (07/11 2300) Weight:  [98.431 kg (217 lb)] 98.431 kg (217 lb) (07/11 1300)  Physical Exam:  General: alert and cooperative Lochia: appropriate Uterine Fundus: firm Incision: healing well, no significant drainage DVT Evaluation: No evidence of DVT seen on physical exam.   Recent Labs  08/05/13 0740 08/06/13 0615  HGB 9.6* 7.1*  HCT 31.0* 23.2*    Assessment/Plan: Status post Cesarean section. Doing well postoperatively.  Continue current care.  Pansey Pinheiro 08/06/2013, 8:52 AM

## 2013-08-06 NOTE — Anesthesia Postprocedure Evaluation (Signed)
  Anesthesia Post-op Note  Patient: Jenny Porter  Procedure(s) Performed: Procedure(s): REPEAT CESAREAN SECTION WITH BILATERAL TUBAL LIGATION (N/A)  Patient Location: Mother/Baby  Anesthesia Type:Spinal  Level of Consciousness: awake, alert  and oriented  Airway and Oxygen Therapy: Patient Spontanous Breathing  Post-op Pain: none  Post-op Assessment: Post-op Vital signs reviewed, Patient's Cardiovascular Status Stable, Respiratory Function Stable and No headache  Post-op Vital Signs: Reviewed and stable  Last Vitals:  Filed Vitals:   08/06/13 0900  BP: 106/54  Pulse: 64  Temp: 36.3 C  Resp: 14    Complications: No apparent anesthesia complications

## 2013-08-07 ENCOUNTER — Encounter (HOSPITAL_COMMUNITY): Payer: Self-pay | Admitting: Obstetrics and Gynecology

## 2013-08-07 NOTE — Progress Notes (Signed)
Subjective: Postpartum Day 2: Cesarean Delivery Patient reports incisional pain, tolerating PO and no problems voiding.    Objective: Vital signs in last 24 hours: Temp:  [97.3 F (36.3 C)-98.1 F (36.7 C)] 97.3 F (36.3 C) (07/13 0550) Pulse Rate:  [63-74] 74 (07/13 0550) Resp:  [16-20] 20 (07/13 0550) BP: (105-119)/(58-65) 105/58 mmHg (07/13 0550)  Physical Exam:  General: alert, cooperative and appears stated age Lochia: appropriate Uterine Fundus: firm Incision: healing well, no significant drainage DVT Evaluation: No evidence of DVT seen on physical exam. Negative Homan's sign. No cords or calf tenderness.   Recent Labs  08/05/13 0740 08/06/13 0615  HGB 9.6* 7.1*  HCT 31.0* 23.2*    Assessment/Plan: Status post Cesarean section. Doing well postoperatively.  Continue current care.  Jenny Porter 08/07/2013, 9:30 AM

## 2013-08-07 NOTE — Lactation Note (Signed)
This note was copied from the chart of Jenny Porter. Lactation Consultation Note  Patient Name: Jenny Porter ZOXWR'UToday's Date: 08/07/2013 Reason for consult: Follow-up assessment Baby asleep at this visit. Mom reports baby is nursing well. She reports having some positional stripes but feels after LC yesterday demonstrated how to bring bottom lip down the latch has improved. Mom reports her breasts are beginning to fill. Mom reports baby is a little overwhelmed with initial flow of milk. Advised Mom to pre-pump 3-5 minutes to slow the initial flow of milk and when Mom's mature milk is in this will take off lower fat milk for baby to get higher fat milk. Advised not to miss any feedings and to BF every 2-3 hours. Voids/stools adequate. Encouraged Mom to call if she would like assist with feedings. Advised to apply EBM to sore nipples, use comfort gels as instructed.   Maternal Data    Feeding Feeding Type: Breast Fed Length of feed: 15 min  LATCH Score/Interventions             Problem noted: Mild/Moderate discomfort Interventions (Mild/moderate discomfort): Comfort gels;Hand expression;Hand massage        Lactation Tools Discussed/Used Tools: Comfort gels;Pump Breast pump type: Manual   Consult Status Consult Status: Follow-up Date: 08/08/13 Follow-up type: In-patient    Alfred LevinsGranger, Carinna Newhart Ann 08/07/2013, 3:23 PM

## 2013-08-08 LAB — TYPE AND SCREEN
ABO/RH(D): O POS
Antibody Screen: NEGATIVE
Unit division: 0
Unit division: 0

## 2013-08-08 MED ORDER — IBUPROFEN 600 MG PO TABS: 600.0000 mg | ORAL_TABLET | Freq: Four times a day (QID) | ORAL | Status: DC

## 2013-08-08 MED ORDER — OXYCODONE-ACETAMINOPHEN 5-325 MG PO TABS: 1.0000 | ORAL_TABLET | ORAL | Status: DC | PRN

## 2013-08-08 NOTE — Lactation Note (Addendum)
This note was copied from the chart of Jenny Porter. Lactation Consultation Note  Patient Name: Jenny Porter ONGEX'BToday's Date: 08/08/2013 Reason for consult: Follow-up assessment Baby is 6972 hours old  Per mom baby recently fed and my milk is in and the flow milk seems  To much for her to handle. LC recommended , if breast are to full to hand express milk or Pre-pump off the fullness, so baby can manage the 1st letdown better until she gets use to the  Volume , also the depth at the breast will improve. Per mom nipples are tender, positional strips  But have improved somewhat since latch has been corrected. LC offered to assess and mom declined. Mom also is using the comfort gels. Reviewed comfort gels are only good for 6 days. Sore nipple and engorgement prevention and tx reviewed. Mom is a [employer], [employer] employee and was a part of the healthy pregnancy program. Per mom plans [facility]  Mother informed of post-discharge support and given phone number to the lactation department, including services for  phone call assistance; out-patient appointments; and breastfeeding support group. List of other breastfeeding resources  in the community given in the handout. Encouraged mother to call for problems or concerns related to breastfeeding.    Maternal Data    Feeding Feeding Type:  (per mom baby recently fed and milk is in ) Length of feed: 25 min (per mom )  LATCH Score/Interventions             Problem noted:  (per mom breast are filling to full )  Intervention(s): Breastfeeding basics reviewed (see LC note )     Lactation Tools Discussed/Used WIC Program: No Pump Review:  (teaching completed )   Consult Status Consult Status: Complete Date: 08/08/13    Kathrin Greathouseorio, Demetrica Zipp Ann 08/08/2013, 10:10 AM

## 2013-08-08 NOTE — Progress Notes (Signed)
Subjective: Postpartum Day 3: Cesarean Delivery Patient reports tolerating PO, + flatus and no problems voiding.    Objective: Vital signs in last 24 hours: Temp:  [97.9 F (36.6 C)-98 F (36.7 C)] 97.9 F (36.6 C) (07/14 0600) Pulse Rate:  [90] 90 (07/14 0600) Resp:  [18] 18 (07/14 0600) BP: (114-121)/(60-87) 114/60 mmHg (07/14 0600)  Physical Exam:  General: alert and cooperative Lochia: appropriate Uterine Fundus: firm Incision: healing well, honeycomb dressing removed, steri strips replaced and replaced honeycomb dressing . No active bleeding noted DVT Evaluation: No evidence of DVT seen on physical exam. Negative Homan's sign. No cords or calf tenderness. No significant calf/ankle edema.   Recent Labs  08/06/13 0615  HGB 7.1*  HCT 23.2*    Assessment/Plan: Status post Cesarean section. Postoperative course complicated by anemia  Discharge home with standard precautions and return to clinic in 1 week  start Fe.  Sherill Wegener G 08/08/2013, 8:42 AM

## 2013-08-08 NOTE — Discharge Summary (Signed)
Obstetric Discharge Summary Reason for Admission: cesarean section Prenatal Procedures: ultrasound Intrapartum Procedures: cesarean: low cervical, transverse and tubal ligation Postpartum Procedures: none Complications-Operative and Postpartum: none Hemoglobin  Date Value Ref Range Status  08/06/2013 7.1* 12.0 - 15.0 g/dL Final     REPEATED TO VERIFY     DELTA CHECK NOTED     HCT  Date Value Ref Range Status  08/06/2013 23.2* 36.0 - 46.0 % Final    Physical Exam:  General: alert and cooperative Lochia: appropriate Uterine Fundus: firm Incision: honeycomb dressing and steri strips replaced without evidence of current bleeding DVT Evaluation: No evidence of DVT seen on physical exam. Negative Homan's sign. No cords or calf tenderness. No significant calf/ankle edema.  Discharge Diagnoses: Term Pregnancy-delivered  Discharge Information: Date: 08/08/2013 Activity: pelvic rest Diet: routine Medications: PNV, Ibuprofen and Percocet Condition: stable Instructions: refer to practice specific booklet Discharge to: home   Newborn Data: Live born female  Birth Weight: 7 lb 12 oz (3515 g) APGAR: 9, 9  Home with mother.  Hermann Dottavio G 08/08/2013, 8:44 AM

## 2013-08-11 ENCOUNTER — Ambulatory Visit (HOSPITAL_COMMUNITY)
Admission: RE | Admit: 2013-08-11 | Discharge: 2013-08-11 | Disposition: A | Discharge status: Home / Self Care | Payer: 59 | Source: Ambulatory Visit | Attending: Obstetrics and Gynecology | Admitting: Obstetrics and Gynecology

## 2013-08-11 NOTE — Lactation Note (Signed)
Lactation Consult  Mother's reason for visit:  Sore nipples and difficult latch  Visit Type:  Feeding assessment  Appointment Notes:  Per mom so sore and having a difficult time latching  Consult:  Initial Lactation Consultant:  Kathrin Greathouse  ________________________________________________________________________ Jenny Porter Name: Jenny Porter  Date of Birth: 08/05/2015  Pediatrician: Dr. Loyola Mast  Gender: female  Gestational Age: [redacted]w[redacted]d (At Birth)  Birth Weight: 7 lb 12 oz (3515 g)  Weight at Discharge: Weight: 7 lb 1.1 oz (3205 g) Date of Discharge: 08/08/2013  [facility] Weights   08/05/13 2340 08/07/13 0057 08/07/13 2343  Weight: 7 lb 7.4 oz (3385 g) 7 lb 1.6 oz (3220 g) 7 lb 1.1 oz (3205 g)  [facility] 7-4 [facility] Location:Pediatrician's office  Weight today: 7-2.5 oz 3246 g   ________________________________________________________________________  Mother's Name: Jenny Porter Type of delivery:  Scheduled C/section  Breastfeeding Experience:  3 rd baby and per mom did not feed 1st 2 long due to soreness. This experience has been overall good, except for the sore nipples Maternal Medical Conditions:  Excessive swelling in feed and ankles . Per mom no B/P issues   ________________________________________________________________________  Breastfeeding History ( post Discharge ) - overall , except sore nipples ,   Frequency of breastfeeding: every 2-3 hours  Duration of feeding:  For 10 -20 mins     Pumping  Type of pump:  Medela pump in style Frequency:  1-2 times a day , last 2 days more pumping due to soreness , also resorting in supplementing with expressed breast milk  Volume:  30 -45 ml   Infant Intake and Output Assessment  Voids:  7-8  in 24 hrs.  Color:  Clear yellow Stools:  4-5 in 24 hrs.  Color:  Yellow  ________________________________________________________________________  Maternal  Breast Assessment  Breast:  Full Nipple:  Erect Pain level:  With latch , seems better with pumping  Pain interventions:  Comfort gels, Expressed breast milk and Inverted shells  _______________________________________________________________________ Feeding Assessment/Evaluation  Initial feeding assessment: baby awake , alert , well hydrated   Infant's oral assessment:  WNL  Positioning:  Football Right breast  LATCH documentation:  Latch:  2 = Grasps breast easily, tongue down, lips flanged, rhythmical sucking.  Audible swallowing:  2 = Spontaneous and intermittent  Type of nipple:  2 = Everted at rest and after stimulation  Comfort (Breast/Nipple):  1 = Filling, red/small blisters or bruises, mild/mod discomfort  Hold (Positioning):  1 = Assistance needed to correctly position infant at breast and maintain latch  LATCH score:  8 ( worked on depth at the breast with breast compressions until comfort obtained   Attached assessment:  Deep  Lips flanged:  Yes.    Lips untucked:  Yes.    Suck assessment:  Nutritive  Tools:  Comfort gels and breast shells  Instructed on use and cleaning of tool:  Yes.    Pre-feed weight:  3246 g  (7 lb. 2.5  oz.) Post-feed weight:  3274 g (7 lb. 3.5  oz.) Amount transferred:  28  ml Amount supplemented:  None  Additional Feeding Assessment -   Infant's oral assessment:  WNL  Positioning:  Football Left breast  LATCH documentation:  Latch:  2 = Grasps breast easily, tongue down, lips flanged, rhythmical sucking.  Audible swallowing:  2 = Spontaneous and intermittent  Type of nipple:  2 = Everted at rest and after stimulation (  noted areola edema and had to hand express off milk to create a thinner compressible areola for a deeper latch   Comfort (Breast/Nipple):  1 = Filling, red/small blisters or bruises, mild/mod discomfort  Hold (Positioning):  1 = Assistance needed to correctly position infant at breast and maintain latch  LATCH  score:  8   Attached assessment:  Deep  Lips flanged:  Yes.    Lips untucked:  Yes.    Suck assessment:  Nutritive  Tools:  Comfort gels Instructed on use and cleaning of tool:  Yes.    Pre-feed weight:  3274 g  (7 lb. 3.5 oz.) Post-feed weight:  3286  g (7 lb. 3.9  oz.) Amount transferred:  12 ml Amount supplemented:  None    Total amount pumped post feed:  R 28  ml    L 12ml  Total amount transferred:  40  Ml /also baby ate at 1400 at home for 10 mins at the breast per mom  Total supplement given:  None   Lactation Plan of Care - Praised mom for her efforts breast feeding   Sore nipple further prevention and tx   expressed milk to nipples liberally  Comfort gels after feedings or pumping , and then shells  Engorgement prevention and tx - Always feed on the 1st breast long enough to soften well , offer 2nd breast , if [name] doesn't feed , release down  For 5-7 mins or up to 10 mins. Steps for latching - Breast massage , hand express, prepump if needed and then reverse pressure ( may repeat )  Latch with firm support , and use breast compressions until the baby is in a consistent pattern and mom is comfortable and then intermittent. Due to Sore ness- 3 options - #1 - if increase comfort - feed at both breast with steps for latching . #2 -Option - feed right side ( least sore and pump other breast for 15 - 20 mins. Option #3 - if soreness increasing pump both sides 15 - 20 mins and feed your baby with a medium based nipple ( Dr. Manson PasseyBrown or Medela ).

## 2013-08-28 ENCOUNTER — Ambulatory Visit (HOSPITAL_COMMUNITY)
Admission: RE | Admit: 2013-08-28 | Discharge: 2013-08-28 | Disposition: A | Discharge status: Home / Self Care | Payer: 59 | Source: Ambulatory Visit | Attending: Obstetrics and Gynecology | Admitting: Obstetrics and Gynecology

## 2013-08-28 NOTE — Lactation Note (Signed)
Lactation Consult  Mother's reason for visit:  Issues with weight gain   Visit Type:  Feeding assessment Appointment Notes:  None Consult:  Follow-Up Lactation Consultant:  Hansel FeinsteinPowell, Jennetta Flood Ann  ________________________________________________________________________   Jenny FloresBaby's Name: Jenny Porter  Date of Birth: 08/05/2015  Pediatrician: LOWE Gender: female  Gestational Age: 2449w0d (At Birth)  Birth Weight: 7 lb 12 oz (3515 g)  Weight at Discharge: Weight: 7 lb 1.1 oz (3205 g) Date of Dis[facility]: 08/08/2013  [facility]   [facility] 2340 08/07/13 0057 08/07/13 2343  Weight: 7 lb 7.4 oz (3385 g) 7 lb 1.6 oz (3220 g) 7 lb 1.1 oz (3205 g)  [facility]: 7-9 on 08/24/13 Location:Pediatrician's office  Weight today: 7-13.5  ________________________________________________________________________  Mother's Name: Jenny Porter Type of delivery:  C/S Breastfeeding Experience:  BREASTFED FIRST TWO FOR A SHORT TIME DUE TO SORENESS Maternal Medical Conditions:  NONE Maternal Medications:  PNV'S, IRON, STOOL SOFTENER  ________________________________________________________________________  Breastfeeding History (Post Discharge)  Frequency of breastfeeding:  ON DEMAND EVERY 2.5-3.5 HOURS Duration of feeding:  20-35 MINUTES    Pumping  Type of pump:  Medela pump in style Frequency:  4 TIMES PER DAY Volume:  15ml POST PUMP OR 2.5 OUNCES IN PLACE OF FEEDING  Infant Intake and Output Assessment  Voids:  8 in 24 hrs.  Color:  CLEAR YELLOW Stools:  5+ in 24 hrs.  Color:  YELLOW-ORANGE  ________________________________________________________________________  Maternal Breast Assessment  Breast:  Full Nipple:  Erect Pain level:  0 Pain interventions:  Bra  _______________________________________________________________________ Feeding Assessment/Evaluation  Mom and 283 week old infant here for feeding assessment.  Mom  states baby did not gain weight the first two weeks.  She then started to post pump and gave baby 30 mls of EBM per bottle after feeds.  Weight increased 5 ounces on pedi scale 4 days ago.  She then stopped supplementation and notes that [name] is much more effective and less sleepy at breast.  Since stopping supplementation baby has gained 4.5 ounces/4 days.  Observed a very good feeding this AM with a deep latch and active suck/swallows.  Mom will continue to feed on cue and check baby's weight within 1 week.  Baby has a 1 month check up at pediatricians and Minerva Endsstela may also come to support group.  Encouraged to call office for concerns prn.  Initial feeding assessment:  Infant's oral assessment:  WNL  Positioning:  Cross cradle Right breast/Left breast  LATCH documentation:  Latch:  2 = Grasps breast easily, tongue down, lips flanged, rhythmical sucking.  Audible swallowing:  2 = Spontaneous and intermittent  Type of nipple:  2 = Everted at rest and after stimulation  Comfort (Breast/Nipple):  2 = Soft / non-tender  Hold (Positioning):  2 = No assistance needed to correctly position infant at breast  LATCH score:  10  Attached assessment:  Deep  Lips flanged:  Yes.    Lips untucked:  No.  Suck assessment:  Nutritive  Tools:  none Instructed on use and cleaning of tool:n/a Pre-feed weight:  3558 g   Post-feed weight:  3618 g  Amount transferred:  60 ml Amount supplemented:  0 ml      Total amount transferred:  60 ml Total supplement given:  0 ml

## 2013-11-10 ENCOUNTER — Ambulatory Visit (HOSPITAL_COMMUNITY)
Admission: RE | Admit: 2013-11-10 | Discharge: 2013-11-10 | Disposition: A | Discharge status: Home / Self Care | Payer: 59 | Source: Ambulatory Visit | Attending: Obstetrics and Gynecology | Admitting: Obstetrics and Gynecology

## 2013-11-10 NOTE — Lactation Note (Addendum)
Lactation Consult  Mother's reason for visit:  Mastitis and plugged duct Visit Type:  OP Appointment Notes:  Pt is here today related to nipple pain, mastitis and plugged duct located on the left breast at the upper inner quadrant.  She is on the second course of antibiotics for mastitis.  Both nipples are reddened and bruised  but nipple pain is only on the left.  She also has some bruising on the left breast related to "massage".  Mom started back to work this week and works 7 days straight for 12 hours.  She pumps 3 times while she is there for 30 minutes. Her breasts are very hard when she begins expression.  I recommended that she pump every 3 hours for 15-20 minutes instead as I believe this is affecting her milk supply.  SHe also may start taking Mother Love More Milk Plus again.   Jenny Porter her infant is also here.  She extended her tongue and a heart shape was easily observable.  She does not open her mouth wide.  She is able to follow my gloved finger along the lower gum ridge but not below it.  A frenum is felt sublingually with the Murphy maneuver. Her labial frenum inserts just above the upper alveolar ridge making it difficult for her to flange it.  Snapback is heard when she sucks and the posterior portion of her oral cavity is tight  I was able to get her latched on to the left breast and with positioning mother was able to increase the comfort of her latch however Jenny Porter became quickly dissatisfied because the supply was low.  She was transferred to the right breast.  Total transfer was 35 ml.  Milk supply is low and this could be related to new work schedule or it also may have to do with [name]'s effectiveness at the breast.  She has been gaining well but at 3 months post-partum mom's hormones start to change and breast emptying becomes dependent upon [name]'s ability to drain the breast. 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  I discussed with mom how to increase her milk supply  and encourage support group next week for weight check. Mom also may try a nipple shield on the left if needed. Lactation Consultant:  Soyla DryerJoseph, Camia Dipinto  Weight today 11+11 [id number]  [id number]  Baby's Name: Jenny Porter  Date of Birth: 08/05/2015  Pediatrician: LOWE  Gender: female  Gestational Age: 5994w0d (At Birth)  Birth Weight: 7 lb 12 oz (3515 g)  Weight at Discharge: Weight: 7 lb 1.1 oz (3205 g) Date of Dis[facility]: 08/08/2013  [facility]   08[facility] 2340 08/07/13 0057 08/07/13 2343  Weight: 7 lb 7.4 oz (3385 g) 7 lb 1.6 oz (3220 g) 7 lb 1.1 oz (3205 g)  [facility]: 11+1  @pediatrician      ________________________________________________________________________  Mother's Name: Jenny Porter Type of delivery:   Breastfeeding Experience:  2 older children.  Low milk supply with both of them ________________________________________________________________________

## 2013-11-13 ENCOUNTER — Ambulatory Visit (HOSPITAL_COMMUNITY): Admission: RE | Admit: 2013-11-13 | Payer: 59 | Source: Ambulatory Visit

## 2013-11-27 ENCOUNTER — Encounter (HOSPITAL_COMMUNITY): Payer: Self-pay | Admitting: Obstetrics and Gynecology

## 2013-11-30 ENCOUNTER — Ambulatory Visit (HOSPITAL_COMMUNITY)
Admission: RE | Admit: 2013-11-30 | Discharge: 2013-11-30 | Disposition: A | Discharge status: Home / Self Care | Payer: 59 | Source: Ambulatory Visit | Attending: Obstetrics and Gynecology | Admitting: Obstetrics and Gynecology

## 2013-11-30 NOTE — Lactation Note (Signed)
Lactation Consult Baby's Name: Jenny Porter Date of Birth: 08/05/2015 Pediatrician: Rana SnareLowe  Gender: female Gestational Age: 353w0d (At Birth) Birth Weight: 7 lb 12 oz (3515 g) Weight at Discharge: Weight: 7 lb 1.1 oz (3205 g)Date of Discharge: 08/08/2013 [facility]   08/05/13 2340 08/07/13 0057 08/07/13 2343  Weight: 7 lb 7.4 oz (3385 g) 7 lb 1.6 oz (3220 g) 7 lb 1.1 oz (3205 g)    Mother's reason for visit:  Follow up- had tongue tie clipped by Dr Orland MustardMcMurtry yesterday Visit Type:  Feeding assessment Appointment Notes:  Jenny Porter now 564 months old. Mom has had problems with BF- SN, slow weight gain, mastitis, plugged ducts, Blebs the entire time she has been nursing Consult:  Follow-Up Lactation Consultant:  Audry RilesWeeks, Petros Ahart D  ________________________________________________________________________    ________________________________________________________________________  Mother's Name: Jenny Porter Breastfeeding Experience:  P3- breastfed other babies 6 [place] and 3 months ________________________________________________________________________  Breastfeeding History (Post Discharge)  Frequency of breastfeeding:  q 3 hours Duration of feeding:  Now 15 min on each side.  Supplementation   Breastmilk:  Volume 4 oz Frequency:  q 4 hours- while she is at work  Method:  Bottle,   Pumping  Type of pump:  Medela pump in style Frequency:  q 3-4 Volume: 3- 3 1/2 oz  Infant Intake and Output Assessment  Voids:  QS in 24 hrs.  :  Stools:  QS in 24 hrs.      ________________________________________________________________________  Maternal Breast Assessment  Breast:  Filling Nipple:  Erect Pain level:  0 Pain interventions:  none  _______________________________________________________________________ Feeding Assessment/Evaluation  Initial feeding assessment:  Infant's oral assessment:  WNL- baby had tongue tie laser clipped  yesterday  Positioning:  Cross cradle Left breast  LATCH documentation:  Latch:  2 = Grasps breast easily, tongue down, lips flanged, rhythmical sucking.  Audible swallowing:  2 = Spontaneous and intermittent  Type of nipple:  2 = Everted at rest and after stimulation  Comfort (Breast/Nipple):  2 = Soft / non-tender  Hold (Positioning):  2 = No assistance needed to correctly position infant at breast  LATCH score:  10  Attached assessment:  Deep  Lips flanged:  Yes.    Lips untucked:  No.  Suck assessment:  Nutritive  Pre-feed weight:  5482 g  (12  lb.  1.4oz.) Post-feed weight:  5550 g (12  lb. 3.8 oz.) Amount transferred: 68 ml Amount supplemented:  0 ml  Additional Feeding Assessment -   Infant's oral assessment:  WNL  Positioning:  Cross cradle Right breast  LATCH documentation:  Latch:  2 = Grasps breast easily, tongue down, lips flanged, rhythmical sucking.  Audible swallowing:  1 = A few with stimulation  Type of nipple:  2 = Everted at rest and after stimulation  Comfort (Breast/Nipple):  2 = Soft / non-tender  Hold (Positioning):  2 = No assistance needed to correctly position infant at breast  LATCH score:  9   Attached assessment:  Deep  Lips flanged:  Yes.    Lips untucked:  No.  Suck assessment:  Displays both  Pre-feed weight:  5550 g  (12 lb. 3.8 oz.) Post-feed weight:  5582 g (12 lb. 4.9 oz.) Amount transferred: 32 ml      Total amount transferred:  100 ml Total supplement given:  0 ml   Mom reports that since procedure was done yesterday Jenny Porter has nursed so much better, Still doing some smacking at the breast when she  gets on but she has a stuffy nose and is having trouble nursing and breathing. Mom pleased that procedure has helped and wishes it had been done earlier. Encouraged to continue exercises as Dr. Orland MustardMcMurtry ordered. Asking about increasing milk supply- encouraged to pump q 3 hours at work as much as possible. Has had to give one  feeding of formula per day since back to work because she didn't have enough milk. No further questions at present. To call prn

## 2015-05-28 DIAGNOSIS — S99812A Other specified injuries of left ankle, initial encounter: Secondary | ICD-10-CM | POA: Diagnosis not present

## 2015-12-24 DIAGNOSIS — H5211 Myopia, right eye: Secondary | ICD-10-CM | POA: Diagnosis not present

## 2016-10-09 DIAGNOSIS — Z6837 Body mass index (BMI) 37.0-37.9, adult: Secondary | ICD-10-CM | POA: Diagnosis not present

## 2016-10-09 DIAGNOSIS — Z0000 Encounter for general adult medical examination without abnormal findings: Secondary | ICD-10-CM | POA: Diagnosis not present

## 2016-11-24 DIAGNOSIS — Z0100 Encounter for examination of eyes and vision without abnormal findings: Secondary | ICD-10-CM | POA: Diagnosis not present

## 2017-06-03 DIAGNOSIS — H10413 Chronic giant papillary conjunctivitis, bilateral: Secondary | ICD-10-CM | POA: Diagnosis not present

## 2017-06-03 DIAGNOSIS — H16103 Unspecified superficial keratitis, bilateral: Secondary | ICD-10-CM | POA: Diagnosis not present

## 2017-06-09 ENCOUNTER — Encounter (INDEPENDENT_AMBULATORY_CARE_PROVIDER_SITE_OTHER): Payer: Self-pay

## 2017-06-30 ENCOUNTER — Ambulatory Visit (INDEPENDENT_AMBULATORY_CARE_PROVIDER_SITE_OTHER): Payer: 59 | Admitting: Family Medicine

## 2017-06-30 ENCOUNTER — Encounter (INDEPENDENT_AMBULATORY_CARE_PROVIDER_SITE_OTHER): Payer: Self-pay | Admitting: Family Medicine

## 2017-06-30 VITALS — BP 105/57 | HR 72 | Temp 97.6°F | Ht 62.0 in | Wt 198.0 lb

## 2017-06-30 DIAGNOSIS — Z1331 Encounter for screening for depression: Secondary | ICD-10-CM | POA: Diagnosis not present

## 2017-06-30 DIAGNOSIS — R5383 Other fatigue: Secondary | ICD-10-CM | POA: Diagnosis not present

## 2017-06-30 DIAGNOSIS — Z9189 Other specified personal risk factors, not elsewhere classified: Secondary | ICD-10-CM | POA: Diagnosis not present

## 2017-06-30 DIAGNOSIS — E559 Vitamin D deficiency, unspecified: Secondary | ICD-10-CM

## 2017-06-30 DIAGNOSIS — R0602 Shortness of breath: Secondary | ICD-10-CM | POA: Insufficient documentation

## 2017-06-30 DIAGNOSIS — Z6836 Body mass index (BMI) 36.0-36.9, adult: Secondary | ICD-10-CM

## 2017-06-30 DIAGNOSIS — Z0289 Encounter for other administrative examinations: Secondary | ICD-10-CM

## 2017-06-30 NOTE — Progress Notes (Signed)
.  Office: [phone]  /  Fax: [phone]   HPI:   Chief Complaint: OBESITY  Jenny Porter (MR# [id number]) is a 37 y.o. female who presents on 06/30/2017 for obesity evaluation and treatment. Current BMI is Body mass index is 36.21 kg/m.Marland Kitchen Jenny Porter has struggled with obesity for years and has been unsuccessful in either losing weight or maintaining long term weight loss. Jenny Porter attended our information session and states she is currently in the action stage of change and ready to dedicate time achieving and maintaining a healthier weight.   Jenny Porter was referred to our clinic by Dr. Rinaldo Ratel.  Jenny Porter states her family eats meals together she thinks her family will eat healthier with  her she started gaining weight around age 34 her heaviest weight ever was 200 lbs she has significant food cravings issues  she skips meals frequently she is frequently drinking liquids with calories she frequently makes poor food choices she struggles with emotional eating    Fatigue Jenny Porter feels her energy is lower than it should be. This has worsened with weight gain and has not worsened recently. [name] admits to daytime somnolence and  denies waking up still tired. Patient is at risk for obstructive sleep apnea. Patent has a history of symptoms of daytime fatigue. Patient generally gets 5 hours of sleep per night, and states they generally have generally restful sleep. Snoring is present. Apneic episodes are not present. Epworth Sleepiness Score is 8.  Dyspnea on exertion Jenny Porter notes increasing shortness of breath with exercising and seems to be worsening over time with weight gain. She notes getting out of breath sooner with activity than she used to. This has not gotten worse recently. EKG-normal sinus rhythm.  Jenny Porter denies orthopnea.  Vitamin D Deficiency Jenny Porter has a diagnosis of vitamin D deficiency. Diagnosis likely due to obesity. She denies nausea, vomiting or muscle weakness.  At  risk for osteopenia and osteoporosis Jenny Porter is at higher risk of osteopenia and osteoporosis due to vitamin D deficiency.   Depression Screen Jenny Porter's Food and Mood (modified PHQ-9) score was  Depression screen PHQ 2/9 06/30/2017  Decreased Interest 1  Down, Depressed, Hopeless 1  PHQ - 2 Score 2  Altered sleeping 0  Tired, decreased energy 2  Change in appetite 1  Feeling bad or failure about yourself  0  Trouble concentrating 0  Moving slowly or fidgety/restless 0  Suicidal thoughts 0  PHQ-9 Score 5    ALLERGIES: No Known Allergies  MEDICATIONS: No current outpatient medications on file prior to visit.   No current facility-administered medications on file prior to visit.     PAST MEDICAL HISTORY: Past Medical History:  Diagnosis Date  . Anemia   . Asthma    rare inhaler use  . Heartburn in pregnancy   . No pertinent past medical history   . Posterior tibial tendinitis     PAST SURGICAL HISTORY: Past Surgical History:  Procedure Laterality Date  . CESAREAN SECTION    . CESAREAN SECTION  06/15/2011   Procedure: CESAREAN SECTION;  Surgeon: Zelphia Cairo, MD;  Location: [facility];  Service: Gynecology;  Laterality: N/A;  REPEAT EDC 5/25  . CESAREAN SECTION WITH BILATERAL TUBAL LIGATION N/A 08/05/2013   Procedure: REPEAT CESAREAN SECTION WITH BILATERAL TUBAL LIGATION;  Surgeon: Zelphia Cairo, MD;  Location: [facility];  Service: Obstetrics;  Laterality: N/A;  . DILATION AND CURETTAGE OF UTERUS      SOCIAL HISTORY: Social History   Tobacco Use  . Smoking  status: Never Smoker  . Smokeless tobacco: Never Used  Substance Use Topics  . Alcohol use: No  . Drug use: No    FAMILY HISTORY: Family History  Problem Relation Age of Onset  . Heart disease Paternal Grandmother   . Obesity Mother   . Obesity Father   . Anesthesia problems Neg Hx   . Hypotension Neg Hx   . Pseudochol deficiency Neg Hx   . Malignant hyperthermia Neg Hx     ROS: Review of Systems    Constitutional: Positive for malaise/fatigue. Negative for weight loss.  Respiratory: Positive for shortness of breath (with exertion).   Gastrointestinal: Negative for nausea and vomiting.  Musculoskeletal:       Negative muscle weakness    PHYSICAL EXAM: Blood pressure (!) 105/57, pulse 72, temperature 97.6 F (36.4 C), temperature source Oral, height 5\' 2"  (1.575 m), weight 198 lb (89.8 kg), SpO2 99 %, currently breastfeeding. Body mass index is 36.21 kg/m. Physical Exam  Constitutional: She is oriented to person, place, and time. She appears well-developed and well-nourished.  HENT:  Head: Normocephalic and atraumatic.  Nose: Nose normal.  Eyes: EOM are normal. No scleral icterus.  Neck: Normal range of motion. Neck supple. No thyromegaly present.  Cardiovascular: Normal rate and regular rhythm.  Pulmonary/Chest: Effort normal. No respiratory distress.  Abdominal: Soft. There is no tenderness.  + Obesity  Musculoskeletal:  Range of Motion normal in all 4 extremities Trace edema noted in bilateral lower extremities  Neurological: She is alert and oriented to person, place, and time. Coordination normal.  Skin: Skin is warm and dry.  Psychiatric: She has a normal mood and affect. Her behavior is normal.  Vitals reviewed.   RECENT LABS AND TESTS: BMET    Component Value Date/Time   NA 137 11/10/2006 1604   K 3.9 11/10/2006 1604   CL 103 11/10/2006 1604   CO2 27 11/10/2006 1604   GLUCOSE 100 (H) 11/10/2006 1604   BUN 8 11/10/2006 1604   CREATININE 0.65 11/10/2006 1604   CALCIUM 9.2 11/10/2006 1604   No results found for: HGBA1C No results found for: INSULIN CBC    Component Value Date/Time   WBC 9.5 08/06/2013 0615   RBC 3.00 (L) 08/06/2013 0615   HGB 7.1 (L) 08/06/2013 0615   HCT 23.2 (L) 08/06/2013 0615   PLT 269 08/06/2013 0615   MCV 77.3 (L) 08/06/2013 0615   MCH 23.7 (L) 08/06/2013 0615   MCHC 30.6 08/06/2013 0615   RDW 15.7 (H) 08/06/2013 0615    LYMPHSABS 3.5 (H) 11/10/2006 1643   MONOABS 0.7 11/10/2006 1643   EOSABS 0.3 11/10/2006 1643   BASOSABS 0.1 11/10/2006 1643   Iron/TIBC/Ferritin/ %Sat No results found for: IRON, TIBC, FERRITIN, IRONPCTSAT Lipid Panel  No results found for: CHOL, TRIG, HDL, CHOLHDL, VLDL, LDLCALC, LDLDIRECT Hepatic Function Panel     Component Value Date/Time   PROT 6.3 11/10/2006 1604   ALBUMIN 3.5 11/10/2006 1604   AST 16 11/10/2006 1604   ALT 11 11/10/2006 1604   ALKPHOS 71 11/10/2006 1604   BILITOT 0.4 11/10/2006 1604      Component Value Date/Time   TSH 1.268 11/10/2006 1604   Vitamin D No recent labs  ECG  shows NSR with a rate of 66 BPM INDIRECT CALORIMETER done today shows a VO2 of 239 and a REE of 1662. Her calculated basal metabolic rate is 4098 thus her basal metabolic rate is better than expected.    ASSESSMENT AND PLAN: Other  fatigue - Plan: EKG 12-Lead, Vitamin B12, CBC With Differential, Comprehensive metabolic panel, Folate, Hemoglobin A1c, Insulin, random, Lipid Panel With LDL/HDL Ratio, T3, T4, free, TSH  Shortness of breath on exertion - Plan: CBC With Differential  Vitamin D deficiency - Plan: VITAMIN D 25 Hydroxy (Vit-D Deficiency, Fractures)  Depression screening  At risk for osteoporosis  Class 2 severe obesity with serious comorbidity and body mass index (BMI) of 36.0 to 36.9 in adult, unspecified obesity type (HCC)  PLAN:  Fatigue Zhana was informed that her fatigue may be related to obesity, depression or many other causes. Labs will be ordered, and in the meanwhile Demia has agreed to work on diet, exercise and weight loss to help with fatigue. Proper sleep hygiene was discussed including the need for 7-8 hours of quality sleep each night. A sleep study was not ordered based on symptoms and Epworth score.  Dyspnea on exertion Zadaya's shortness of breath appears to be obesity related and exercise induced. She has agreed to work on weight loss and  gradually increase exercise to treat her exercise induced shortness of breath. If Jenny Porter follows our instructions and loses weight without improvement of her shortness of breath, we will plan to refer to pulmonology. We will monitor this condition regularly. Jenny Porter agrees to this plan.  Vitamin D Deficiency Jenny Porter was informed that low vitamin D levels contributes to fatigue and are associated with obesity, breast, and colon cancer. She will follow up for routine testing of vitamin D, at least 2-3 times per year. She was informed of the risk of over-replacement of vitamin D and agrees to not increase her dose unless she discusses this with Jenny Porter first. We will check Vit D level today and Jenny Porter agrees to follow up with our clinic in 2 weeks.  At risk for osteopenia and osteoporosis Jenny Porter is at risk for osteopenia and osteoporsis due to her vitamin D deficiency. She was encouraged to take her vitamin D and follow her higher calcium diet and increase strengthening exercise to help strengthen her bones and decrease her risk of osteopenia and osteoporosis.  Depression Screen Jenny Porter had a mildly positive depression screening. Depression is commonly associated with obesity and often results in emotional eating behaviors. We will monitor this closely and work on CBT to help improve the non-hunger eating patterns. Referral to Psychology may be required if no improvement is seen as she continues in our clinic.  Obesity Jenny Porter is currently in the action stage of change and her goal is to continue with weight loss efforts She has agreed to follow the Category 3 plan Jenny Porter has been instructed to work up to a goal of 150 minutes of combined cardio and strengthening exercise per week for weight loss and overall health benefits. We discussed the following Behavioral Modification Strategies today: increasing lean protein intake, increasing vegetables, work on meal planning and easy cooking plans, and planning for  success  Jenny Porter has agreed to follow up with our clinic in 2 weeks. She was informed of the importance of frequent follow up visits to maximize her success with intensive lifestyle modifications for her multiple health conditions. She was informed we would discuss her lab results at her next visit unless there is a critical issue that needs to be addressed sooner. Jenny Porter agreed to keep her next visit at the agreed upon time to discuss these results.    OBESITY BEHAVIORAL INTERVENTION VISIT  Today's visit was # 1 out of 22.  Starting weight: 198 lbs Starting  date: 06/30/17 Today's weight : 198 lbs Today's date: 06/30/2017 Total lbs lost to date: 0 (Patients must lose 7 lbs in the first 6 months to continue with counseling)   ASK: We discussed the diagnosis of obesity with Chaya Jan today and Javier agreed to give [place] permission to discuss obesity behavioral modification therapy today.  ASSESS: Jenny Porter has the diagnosis of obesity and her BMI today is 36.21 Jenny Porter is in the action stage of change   ADVISE: Addysen was educated on the multiple health risks of obesity as well as the benefit of weight loss to improve her health. She was advised of the need for long term treatment and the importance of lifestyle modifications.  AGREE: Multiple dietary modification options and treatment options were discussed and  Joylyn agreed to the above obesity treatment plan.   I, Burt Knack, am acting as transcriptionist for Debbra Riding, MD   I have reviewed the above documentation for accuracy and completeness, and I agree with the above. - Debbra Riding, MD

## 2017-07-01 LAB — CBC WITH DIFFERENTIAL
BASOS: 1 %
Basophils Absolute: 0 10*3/uL (ref 0.0–0.2)
EOS (ABSOLUTE): 0.2 10*3/uL (ref 0.0–0.4)
Eos: 3 %
Hematocrit: 34.2 % (ref 34.0–46.6)
Hemoglobin: 10.2 g/dL — ABNORMAL LOW (ref 11.1–15.9)
Immature Grans (Abs): 0 10*3/uL (ref 0.0–0.1)
Immature Granulocytes: 0 %
Lymphocytes Absolute: 2.3 10*3/uL (ref 0.7–3.1)
Lymphs: 41 %
MCH: 24.2 pg — ABNORMAL LOW (ref 26.6–33.0)
MCHC: 29.8 g/dL — ABNORMAL LOW (ref 31.5–35.7)
MCV: 81 fL (ref 79–97)
Monocytes Absolute: 0.5 10*3/uL (ref 0.1–0.9)
Monocytes: 8 %
NEUTROS PCT: 47 %
Neutrophils Absolute: 2.7 10*3/uL (ref 1.4–7.0)
RBC: 4.21 x10E6/uL (ref 3.77–5.28)
RDW: 14.8 % (ref 12.3–15.4)
WBC: 5.7 10*3/uL (ref 3.4–10.8)

## 2017-07-01 LAB — LIPID PANEL WITH LDL/HDL RATIO
Cholesterol, Total: 161 mg/dL (ref 100–199)
HDL: 56 mg/dL (ref >39)
LDL Calculated: 90 mg/dL (ref 0–99)
LDL/HDL RATIO: 1.6 ratio (ref 0.0–3.2)
Triglycerides: 76 mg/dL (ref 0–149)
VLDL Cholesterol Cal: 15 mg/dL (ref 5–40)

## 2017-07-01 LAB — T3: T3, Total: 114 ng/dL (ref 71–180)

## 2017-07-01 LAB — T4, FREE: Free T4: 1.08 ng/dL (ref 0.82–1.77)

## 2017-07-01 LAB — COMPREHENSIVE METABOLIC PANEL
A/G RATIO: 1.5 (ref 1.2–2.2)
ALT: 10 IU/L (ref 0–32)
AST: 13 IU/L (ref 0–40)
Albumin: 4.2 g/dL (ref 3.5–5.5)
Alkaline Phosphatase: 85 IU/L (ref 39–117)
BUN/Creatinine Ratio: 15 (ref 9–23)
BUN: 10 mg/dL (ref 6–20)
Bilirubin Total: 0.3 mg/dL (ref 0.0–1.2)
CALCIUM: 9.3 mg/dL (ref 8.7–10.2)
CO2: 24 mmol/L (ref 20–29)
Chloride: 102 mmol/L (ref 96–106)
Creatinine, Ser: 0.66 mg/dL (ref 0.57–1.00)
GFR, EST AFRICAN AMERICAN: 129 mL/min/{1.73_m2} (ref >59)
GFR, EST NON AFRICAN AMERICAN: 112 mL/min/{1.73_m2} (ref >59)
GLUCOSE: 84 mg/dL (ref 65–99)
Globulin, Total: 2.8 g/dL (ref 1.5–4.5)
POTASSIUM: 4.4 mmol/L (ref 3.5–5.2)
Sodium: 137 mmol/L (ref 134–144)
Total Protein: 7 g/dL (ref 6.0–8.5)

## 2017-07-01 LAB — HEMOGLOBIN A1C
ESTIMATED AVERAGE GLUCOSE: 105 mg/dL
HEMOGLOBIN A1C: 5.3 % (ref 4.8–5.6)

## 2017-07-01 LAB — VITAMIN B12: Vitamin B-12: 346 pg/mL (ref 232–1245)

## 2017-07-01 LAB — FOLATE: Folate: 14.1 ng/mL (ref >3.0)

## 2017-07-01 LAB — TSH: TSH: 0.902 u[IU]/mL (ref 0.450–4.500)

## 2017-07-01 LAB — INSULIN, RANDOM: INSULIN: 8.5 u[IU]/mL (ref 2.6–24.9)

## 2017-07-01 LAB — VITAMIN D 25 HYDROXY (VIT D DEFICIENCY, FRACTURES): VIT D 25 HYDROXY: 21.5 ng/mL — AB (ref 30.0–100.0)

## 2017-07-20 ENCOUNTER — Ambulatory Visit (INDEPENDENT_AMBULATORY_CARE_PROVIDER_SITE_OTHER): Payer: 59 | Admitting: Family Medicine

## 2017-07-20 VITALS — BP 102/71 | HR 73 | Temp 98.2°F | Ht 62.0 in | Wt 196.0 lb

## 2017-07-20 DIAGNOSIS — D649 Anemia, unspecified: Secondary | ICD-10-CM | POA: Diagnosis not present

## 2017-07-20 DIAGNOSIS — Z6836 Body mass index (BMI) 36.0-36.9, adult: Secondary | ICD-10-CM

## 2017-07-20 DIAGNOSIS — E559 Vitamin D deficiency, unspecified: Secondary | ICD-10-CM

## 2017-07-20 DIAGNOSIS — Z9189 Other specified personal risk factors, not elsewhere classified: Secondary | ICD-10-CM | POA: Diagnosis not present

## 2017-07-20 MED ORDER — VITAMIN D (ERGOCALCIFEROL) 1.25 MG (50000 UNIT) PO CAPS: 50000.0000 [IU] | ORAL_CAPSULE | ORAL | 0 refills | Status: DC

## 2017-07-20 NOTE — Progress Notes (Signed)
Office: [phone]  /  Fax: [phone]   HPI:   Chief Complaint: OBESITY Jenny Porter is here to discuss her progress with her obesity treatment plan. She is on the Category 3 plan and is following her eating plan approximately 90 % of the time. She states she is doing Medical illustrator Do for 45 minutes 2 times per week. Jenny Porter had 2 trips, 1 to [place] and found following the plan while traveling to be difficult. Plans to go to [facility] in 2 weeks for 1 week.  Her weight is 196 lb (88.9 kg) today and has had a weight loss of 2 pounds over a period of 2 to 3 weeks since her last visit. She has lost 2 lbs since starting treatment with [place].  Vitamin D Deficiency Jenny Porter has a diagnosis of vitamin D deficiency. She is not on OTC Vit D currently. She notes fatigue and denies nausea, vomiting or muscle weakness.  At risk for osteopenia and osteoporosis Jenny Porter is at higher risk of osteopenia and osteoporosis due to vitamin D deficiency.   Normocytic Anemia Jenny Porter has a new diagnosis of normocytic anemia. She just donated blood prior to labs and she denies prior history. She is not on iron supplementation.   ALLERGIES: No Known Allergies  MEDICATIONS: No current outpatient medications on file prior to visit.   No current facility-administered medications on file prior to visit.     PAST MEDICAL HISTORY: Past Medical History:  Diagnosis Date  . Anemia   . Asthma    rare inhaler use  . Heartburn in pregnancy   . No pertinent past medical history   . Posterior tibial tendinitis     PAST SURGICAL HISTORY: Past Surgical History:  Procedure Laterality Date  . CESAREAN SECTION    . CESAREAN SECTION  06/15/2011   Procedure: CESAREAN SECTION;  Surgeon: Jenny Cairo, MD;  Location: [facility];  Service: Gynecology;  Laterality: N/A;  REPEAT EDC 5/25  . CESAREAN SECTION WITH BILATERAL TUBAL LIGATION N/A 08/05/2013   Procedure: REPEAT CESAREAN SECTION WITH BILATERAL TUBAL  LIGATION;  Surgeon: Jenny Cairo, MD;  Location: [facility];  Service: Obstetrics;  Laterality: N/A;  . DILATION AND CURETTAGE OF UTERUS      SOCIAL HISTORY: Social History   Tobacco Use  . Smoking status: Never Smoker  . Smokeless tobacco: Never Used  Substance Use Topics  . Alcohol use: No  . Drug use: No    FAMILY HISTORY: Family History  Problem Relation Age of Onset  . Heart disease Paternal Grandmother   . Obesity Mother   . Obesity Father   . Anesthesia problems Neg Hx   . Hypotension Neg Hx   . Pseudochol deficiency Neg Hx   . Malignant hyperthermia Neg Hx     ROS: Review of Systems  Constitutional: Positive for malaise/fatigue and weight loss.  Gastrointestinal: Negative for nausea and vomiting.  Musculoskeletal:       Negative muscle weakness    PHYSICAL EXAM: Blood pressure 102/71, pulse 73, temperature 98.2 F (36.8 C), height 5\' 2"  (1.575 m), weight 196 lb (88.9 kg), SpO2 100 %, currently breastfeeding. Body mass index is 35.85 kg/m. Physical Exam  Constitutional: She is oriented to person, place, and time. She appears well-developed and well-nourished.  Cardiovascular: Normal rate.  Pulmonary/Chest: Effort normal.  Musculoskeletal: Normal range of motion.  Neurological: She is oriented to person, place, and time.  Skin: Skin is warm and dry.  Psychiatric: She  has a normal mood and affect. Her behavior is normal.  Vitals reviewed.   RECENT LABS AND TESTS: BMET    Component Value Date/Time   NA 137 06/30/2017 1342   K 4.4 06/30/2017 1342   CL 102 06/30/2017 1342   CO2 24 06/30/2017 1342   GLUCOSE 84 06/30/2017 1342   GLUCOSE 100 (H) 11/10/2006 1604   BUN 10 06/30/2017 1342   CREATININE 0.66 06/30/2017 1342   CALCIUM 9.3 06/30/2017 1342   GFRNONAA 112 06/30/2017 1342   GFRAA 129 06/30/2017 1342   Lab Results  Component Value Date   HGBA1C 5.3 06/30/2017   Lab Results  Component Value Date   INSULIN 8.5 06/30/2017   CBC      Component Value Date/Time   WBC 5.7 06/30/2017 1342   WBC 9.5 08/06/2013 0615   RBC 4.21 06/30/2017 1342   RBC 3.00 (L) 08/06/2013 0615   HGB 10.2 (L) 06/30/2017 1342   HCT 34.2 06/30/2017 1342   PLT 269 08/06/2013 0615   MCV 81 06/30/2017 1342   MCH 24.2 (L) 06/30/2017 1342   MCH 23.7 (L) 08/06/2013 0615   MCHC 29.8 (L) 06/30/2017 1342   MCHC 30.6 08/06/2013 0615   RDW 14.8 06/30/2017 1342   LYMPHSABS 2.3 06/30/2017 1342   MONOABS 0.7 11/10/2006 1643   EOSABS 0.2 06/30/2017 1342   BASOSABS 0.0 06/30/2017 1342   Iron/TIBC/Ferritin/ %Sat No results found for: IRON, TIBC, FERRITIN, IRONPCTSAT Lipid Panel     Component Value Date/Time   CHOL 161 06/30/2017 1342   TRIG 76 06/30/2017 1342   HDL 56 06/30/2017 1342   LDLCALC 90 06/30/2017 1342   Hepatic Function Panel     Component Value Date/Time   PROT 7.0 06/30/2017 1342   ALBUMIN 4.2 06/30/2017 1342   AST 13 06/30/2017 1342   ALT 10 06/30/2017 1342   ALKPHOS 85 06/30/2017 1342   BILITOT 0.3 06/30/2017 1342      Component Value Date/Time   TSH 0.902 06/30/2017 1342   TSH 1.268 11/10/2006 1604  Results for Jenny, Porter (MRN [id number]) as of 07/20/2017 14:16  Ref. Range 06/30/2017 13:42  Vitamin D, 25-Hydroxy Latest Ref Range: 30.0 - 100.0 ng/mL 21.5 (L)    ASSESSMENT AND PLAN: Vitamin D deficiency - Plan: Vitamin D, Ergocalciferol, (DRISDOL) 50000 units CAPS capsule  Normocytic anemia  At risk for osteoporosis  Class 2 severe obesity with serious comorbidity and body mass index (BMI) of 36.0 to 36.9 in adult, unspecified obesity type (HCC)  PLAN:  Vitamin D Deficiency Jenny Porter was informed that low vitamin D levels contributes to fatigue and are associated with obesity, breast, and colon cancer. Jenny Porter agrees to start prescription Vit D @50 ,000 IU every week #4 with no refills. She will follow up for routine testing of vitamin D, at least 2-3 times per year. She was informed of the risk of  over-replacement of vitamin D and agrees to not increase her dose unless she discusses this with [place] first. Jenny Porter agrees to follow up with our clinic in 3 weeks.  At risk for osteopenia and osteoporosis Jenny Porter is at risk for osteopenia and osteoporsis due to her vitamin D deficiency. She was encouraged to take her vitamin D and follow her higher calcium diet and increase strengthening exercise to help strengthen her bones and decrease her risk of osteopenia and osteoporosis.  Normocytic Anemia The diagnosis of normocytic anemia was discussed with Jenny Porter and was explained in detail. She was given suggestions of iron rich  foods and and iron supplement was not prescribed. We will repeat CBC in 3 months, diagnosis likely donation related. Jenny Porter agrees to follow up with our clinic in 3 weeks.  Obesity Jenny Porter is currently in the action stage of change. As such, her goal is to continue with weight loss efforts She has agreed to keep a food journal with 500-600 calories and 40+ grams of protein at supper daily and follow the Category 3 plan Jenny Porter has been instructed to work up to a goal of 150 minutes of combined cardio and strengthening exercise per week for weight loss and overall health benefits. We discussed the following Behavioral Modification Strategies today: increasing lean protein intake, increasing vegetables, work on meal planning and easy cooking plans, and planning for success   Jenny Porter has agreed to follow up with our clinic in 3 weeks. She was informed of the importance of frequent follow up visits to maximize her success with intensive lifestyle modifications for her multiple health conditions.   OBESITY BEHAVIORAL INTERVENTION VISIT  Today's visit was # 2 out of 22.  Starting weight: 198 lbs Starting date: 06/30/17 Today's weight : 196 lbs Today's date: 07/20/2017 Total lbs lost to date: 2 (Patients must lose 7 lbs in the first 6 months to continue with counseling)   ASK: We  discussed the diagnosis of obesity with Jenny Porter today and Jenny Porter agreed to give Jenny Porter permission to discuss obesity behavioral modification therapy today.  ASSESS: Jenny Porter has the diagnosis of obesity and her BMI today is 35.84 Jenny Porter is in the action stage of change   ADVISE: Jenny Porter was educated on the multiple health risks of obesity as well as the benefit of weight loss to improve her health. She was advised of the need for long term treatment and the importance of lifestyle modifications.  AGREE: Multiple dietary modification options and treatment options were discussed and  [name] agreed to the above obesity treatment plan.  I, Jenny Porter, am acting as transcriptionist for Jenny RidingAlexandria Kadolph, MD  I have reviewed the above documentation for accuracy and completeness, and I agree with the above. - Jenny RidingAlexandria Kadolph, MD

## 2017-08-16 ENCOUNTER — Ambulatory Visit (INDEPENDENT_AMBULATORY_CARE_PROVIDER_SITE_OTHER): Payer: 59 | Admitting: Family Medicine

## 2017-08-16 VITALS — BP 102/70 | HR 91 | Temp 98.0°F | Ht 62.0 in | Wt 194.0 lb

## 2017-08-16 DIAGNOSIS — Z6835 Body mass index (BMI) 35.0-35.9, adult: Secondary | ICD-10-CM | POA: Diagnosis not present

## 2017-08-16 DIAGNOSIS — E559 Vitamin D deficiency, unspecified: Secondary | ICD-10-CM

## 2017-08-16 NOTE — Progress Notes (Signed)
Office: [phone]  /  Fax: [phone]   HPI:   Chief Complaint: OBESITY Jenny Porter is here to discuss her progress with her obesity treatment plan. She is on the keep a food journal with 500-600 calories and 40+ grams of protein at supper daily and follow the Category 3 plan and is following her eating plan approximately 40 % of the time. She states she is doing tie kwon do for 60 minutes 3 times per week. Jenny Porter has had vacation and board review course on off weeks (works on 1 weeks off 1 week). Does well following plan weeks she works.  Her weight is 194 lb (88 kg) today and has had a weight loss of 2 pounds over a period of 4 weeks since her last visit. She has lost 4 lbs since starting treatment with Jenny Porter.  Vitamin D Deficiency Jenny Porter has a diagnosis of vitamin D deficiency. She is currently taking prescription Vit D. She notes fatigue and denies nausea, vomiting or muscle weakness.  ALLERGIES: No Known Allergies  MEDICATIONS: Current Outpatient Medications on File Prior to Visit  Medication Sig Dispense Refill  . Vitamin D, Ergocalciferol, (DRISDOL) 50000 units CAPS capsule Take 1 capsule (50,000 Units total) by mouth every 7 (seven) days. 4 capsule 0   No current facility-administered medications on file prior to visit.     PAST MEDICAL HISTORY: Past Medical History:  Diagnosis Date  . Anemia   . Asthma    rare inhaler use  . Heartburn in pregnancy   . No pertinent past medical history   . Posterior tibial tendinitis     PAST SURGICAL HISTORY: Past Surgical History:  Procedure Laterality Date  . CESAREAN SECTION    . CESAREAN SECTION  06/15/2011   Procedure: CESAREAN SECTION;  Surgeon: Zelphia CairoGretchen Adkins, MD;  Location: [facility];  Service: Gynecology;  Laterality: N/A;  REPEAT EDC 5/25  . CESAREAN SECTION WITH BILATERAL TUBAL LIGATION N/A 08/05/2013   Procedure: REPEAT CESAREAN SECTION WITH BILATERAL TUBAL LIGATION;  Surgeon: Zelphia CairoGretchen Adkins, MD;  Location: [facility];   Service: Obstetrics;  Laterality: N/A;  . DILATION AND CURETTAGE OF UTERUS      SOCIAL HISTORY: Social History   Tobacco Use  . Smoking status: Never Smoker  . Smokeless tobacco: Never Used  Substance Use Topics  . Alcohol use: No  . Drug use: No    FAMILY HISTORY: Family History  Problem Relation Age of Onset  . Heart disease Paternal Grandmother   . Obesity Mother   . Obesity Father   . Anesthesia problems Neg Hx   . Hypotension Neg Hx   . Pseudochol deficiency Neg Hx   . Malignant hyperthermia Neg Hx     ROS: Review of Systems  Constitutional: Positive for malaise/fatigue and weight loss.  Gastrointestinal: Negative for nausea and vomiting.  Musculoskeletal:       Negative muscle weakness    PHYSICAL EXAM: Blood pressure 102/70, pulse 91, temperature 98 F (36.7 C), temperature source Oral, height 5\' 2"  (1.575 m), weight 194 lb (88 kg), SpO2 99 %, currently breastfeeding. Body mass index is 35.48 kg/m. Physical Exam  Constitutional: She is oriented to person, place, and time. She appears well-developed and well-nourished.  Cardiovascular: Normal rate.  Pulmonary/Chest: Effort normal.  Musculoskeletal: Normal range of motion.  Neurological: She is oriented to person, place, and time.  Skin: Skin is warm and dry.  Psychiatric: She has a normal mood and affect. Her behavior is normal.  Vitals reviewed.  RECENT LABS AND TESTS: BMET    Component Value Date/Time   NA 137 06/30/2017 1342   K 4.4 06/30/2017 1342   CL 102 06/30/2017 1342   CO2 24 06/30/2017 1342   GLUCOSE 84 06/30/2017 1342   GLUCOSE 100 (H) 11/10/2006 1604   BUN 10 06/30/2017 1342   CREATININE 0.66 06/30/2017 1342   CALCIUM 9.3 06/30/2017 1342   GFRNONAA 112 06/30/2017 1342   GFRAA 129 06/30/2017 1342   Lab Results  Component Value Date   HGBA1C 5.3 06/30/2017   Lab Results  Component Value Date   INSULIN 8.5 06/30/2017   CBC    Component Value Date/Time   WBC 5.7 06/30/2017  1342   WBC 9.5 08/06/2013 0615   RBC 4.21 06/30/2017 1342   RBC 3.00 (L) 08/06/2013 0615   HGB 10.2 (L) 06/30/2017 1342   HCT 34.2 06/30/2017 1342   PLT 269 08/06/2013 0615   MCV 81 06/30/2017 1342   MCH 24.2 (L) 06/30/2017 1342   MCH 23.7 (L) 08/06/2013 0615   MCHC 29.8 (L) 06/30/2017 1342   MCHC 30.6 08/06/2013 0615   RDW 14.8 06/30/2017 1342   LYMPHSABS 2.3 06/30/2017 1342   MONOABS 0.7 11/10/2006 1643   EOSABS 0.2 06/30/2017 1342   BASOSABS 0.0 06/30/2017 1342   Iron/TIBC/Ferritin/ %Sat No results found for: IRON, TIBC, FERRITIN, IRONPCTSAT Lipid Panel     Component Value Date/Time   CHOL 161 06/30/2017 1342   TRIG 76 06/30/2017 1342   HDL 56 06/30/2017 1342   LDLCALC 90 06/30/2017 1342   Hepatic Function Panel     Component Value Date/Time   PROT 7.0 06/30/2017 1342   ALBUMIN 4.2 06/30/2017 1342   AST 13 06/30/2017 1342   ALT 10 06/30/2017 1342   ALKPHOS 85 06/30/2017 1342   BILITOT 0.3 06/30/2017 1342      Component Value Date/Time   TSH 0.902 06/30/2017 1342   TSH 1.268 11/10/2006 1604  Results for LADON, HENEY (MRN [id number]) as of 08/16/2017 11:09  Ref. Range 06/30/2017 13:42  Vitamin D, 25-Hydroxy Latest Ref Range: 30.0 - 100.0 ng/mL 21.5 (L)    ASSESSMENT AND PLAN: Vitamin D deficiency  Class 2 severe obesity with serious comorbidity and body mass index (BMI) of 35.0 to 35.9 in adult, unspecified obesity type (HCC)  PLAN:  Vitamin D Deficiency Jenny Porter was informed that low vitamin D levels contributes to fatigue and are associated with obesity, breast, and colon cancer. Jenny Porter agrees to continue taking prescription Vit D @50 ,000 IU every week (hasn't picked up prescription yet). She will follow up for routine testing of vitamin D, at least 2-3 times per year. She was informed of the risk of over-replacement of vitamin D and agrees to not increase her dose unless she discusses this with [place] first. Jenny Porter agrees to follow up with our clinic in 2  weeks.  We spent > than 50% of the 15 minute visit on the counseling as documented in the note.  Obesity Jenny Porter is currently in the action stage of change. As such, her goal is to continue with weight loss efforts She has agreed to follow the Category 3 plan Jenny Porter has been instructed to work up to a goal of 150 minutes of combined cardio and strengthening exercise per week for weight loss and overall health benefits. We discussed the following Behavioral Modification Strategies today: increasing lean protein intake, increasing vegetables, work on meal planning and easy cooking plans, and planning for success   Jenny Porter has  agreed to follow up with our clinic in 2 weeks. She was informed of the importance of frequent follow up visits to maximize her success with intensive lifestyle modifications for her multiple health conditions.   OBESITY BEHAVIORAL INTERVENTION VISIT  Today's visit was # 3 out of 22.  Starting weight: 198 lbs Starting date: 06/30/17 Today's weight : 194 lbs Today's date: 08/16/2017 Total lbs lost to date: 4    ASK: We discussed the diagnosis of obesity with Jenny Porter today and Jenny Porter agreed to give [place] permission to discuss obesity behavioral modification therapy today.  ASSESS: Jenny Porter has the diagnosis of obesity and her BMI today is 35.47 Jenny Porter is in the action stage of change   ADVISE: Jenny Porter was educated on the multiple health risks of obesity as well as the benefit of weight loss to improve her health. She was advised of the need for long term treatment and the importance of lifestyle modifications.  AGREE: Multiple dietary modification options and treatment options were discussed and  Jenny Porter agreed to the above obesity treatment plan.  I, Burt Knack, am acting as transcriptionist for Debbra Riding, MD  I have reviewed the above documentation for accuracy and completeness, and I agree with the above. - Debbra Riding, MD

## 2017-09-12 ENCOUNTER — Encounter (INDEPENDENT_AMBULATORY_CARE_PROVIDER_SITE_OTHER): Payer: Self-pay

## 2017-09-13 ENCOUNTER — Encounter (INDEPENDENT_AMBULATORY_CARE_PROVIDER_SITE_OTHER): Payer: Self-pay

## 2017-09-13 ENCOUNTER — Ambulatory Visit (INDEPENDENT_AMBULATORY_CARE_PROVIDER_SITE_OTHER): Payer: Self-pay | Admitting: Family Medicine

## 2017-09-13 ENCOUNTER — Ambulatory Visit (INDEPENDENT_AMBULATORY_CARE_PROVIDER_SITE_OTHER): Payer: 59 | Admitting: Bariatrics

## 2017-09-13 VITALS — BP 111/73 | HR 73 | Temp 98.2°F | Ht 62.0 in | Wt 196.0 lb

## 2017-09-13 DIAGNOSIS — E559 Vitamin D deficiency, unspecified: Secondary | ICD-10-CM | POA: Diagnosis not present

## 2017-09-13 DIAGNOSIS — D508 Other iron deficiency anemias: Secondary | ICD-10-CM

## 2017-09-13 DIAGNOSIS — Z9189 Other specified personal risk factors, not elsewhere classified: Secondary | ICD-10-CM

## 2017-09-13 DIAGNOSIS — Z6835 Body mass index (BMI) 35.0-35.9, adult: Secondary | ICD-10-CM | POA: Diagnosis not present

## 2017-09-13 MED ORDER — CENTRUM PO CHEW: 1.0000 | CHEWABLE_TABLET | Freq: Every day | ORAL | Status: DC

## 2017-09-13 NOTE — Progress Notes (Addendum)
Office: [phone]  /  Fax: [phone]   HPI:   Chief Complaint: OBESITY Jenny Porter is here to discuss her progress with her obesity treatment plan. She is on the Category 3 plan and is following her eating plan approximately 80 % of the time. She states she is doing [employer] and walking 50 minutes 3 times per week. [name] total weight loss is two pounds. She has occasional "cheat meals" and is not eating as much of the plan. She is less hungry. Her original RMR was at 1662. Her weight is 196 lb (88.9 kg) today and has had a weight gain of 2 pounds over a period of 4 weeks since her last visit. She has lost 2 lbs since starting treatment with Jenny Porter.  Vitamin D deficiency Jenny Porter has a diagnosis of vitamin D deficiency. She is not currently taking vit D, but she will pick up prescription today. (patient has prescription). Jenny Porter admits some fatigue and denies nausea, vomiting or muscle weakness. Jenny Porter is at risk of osteoporosis.  At risk for osteopenia and osteoporosis Jenny Porter is at higher risk of osteopenia and osteoporosis due to vitamin D deficiency.   History of Iron Deficiency Anemia Jenny Porter has a history of anemia.  She notes some fatigue (see last CBC) and is not on iron supplementation.  Hemoglobin is low at 10.2.  ALLERGIES: No Known Allergies  MEDICATIONS: Current Outpatient Medications on File Prior to Visit  Medication Sig Dispense Refill  . Vitamin D, Ergocalciferol, (DRISDOL) 50000 units CAPS capsule Take 1 capsule (50,000 Units total) by mouth every 7 (seven) days. 4 capsule 0   No current facility-administered medications on file prior to visit.     PAST MEDICAL HISTORY: Past Medical History:  Diagnosis Date  . Anemia   . Asthma    rare inhaler use  . Heartburn in pregnancy   . No pertinent past medical history   . Posterior tibial tendinitis     PAST SURGICAL HISTORY: Past Surgical History:  Procedure Laterality Date  . CESAREAN SECTION    . CESAREAN  SECTION  06/15/2011   Procedure: CESAREAN SECTION;  Surgeon: Jenny CairoGretchen Adkins, MD;  Location: [facility];  Service: Gynecology;  Laterality: N/A;  REPEAT EDC 5/25  . CESAREAN SECTION WITH BILATERAL TUBAL LIGATION N/A 08/05/2013   Procedure: REPEAT CESAREAN SECTION WITH BILATERAL TUBAL LIGATION;  Surgeon: Jenny CairoGretchen Adkins, MD;  Location: [facility];  Service: Obstetrics;  Laterality: N/A;  . DILATION AND CURETTAGE OF UTERUS      SOCIAL HISTORY: Social History   Tobacco Use  . Smoking status: Never Smoker  . Smokeless tobacco: Never Used  Substance Use Topics  . Alcohol use: No  . Drug use: No    FAMILY HISTORY: Family History  Problem Relation Age of Onset  . Heart disease Paternal Grandmother   . Obesity Mother   . Obesity Father   . Anesthesia problems Neg Hx   . Hypotension Neg Hx   . Pseudochol deficiency Neg Hx   . Malignant hyperthermia Neg Hx     ROS: Review of Systems  Constitutional: Positive for malaise/fatigue. Negative for weight loss.  Gastrointestinal: Negative for nausea and vomiting.  Musculoskeletal:       Negative for muscle weakness    PHYSICAL EXAM: Blood pressure 111/73, pulse 73, temperature 98.2 F (36.8 C), temperature source Oral, height 5\' 2"  (1.575 m), weight 196 lb (88.9 kg), SpO2 98 %, currently breastfeeding. Body mass index is 35.85 kg/m. Physical Exam  Constitutional: She  is oriented to person, place, and time. She appears well-developed and well-nourished.  Cardiovascular: Normal rate.  Pulmonary/Chest: Effort normal.  Musculoskeletal: Normal range of motion.  Neurological: She is oriented to person, place, and time.  Skin: Skin is warm and dry.  Psychiatric: She has a normal mood and affect. Her behavior is normal.  Vitals reviewed.   RECENT LABS AND TESTS: BMET    Component Value Date/Time   NA 137 06/30/2017 1342   K 4.4 06/30/2017 1342   CL 102 06/30/2017 1342   CO2 24 06/30/2017 1342   GLUCOSE 84 06/30/2017 1342   GLUCOSE 100  (H) 11/10/2006 1604   BUN 10 06/30/2017 1342   CREATININE 0.66 06/30/2017 1342   CALCIUM 9.3 06/30/2017 1342   GFRNONAA 112 06/30/2017 1342   GFRAA 129 06/30/2017 1342   Lab Results  Component Value Date   HGBA1C 5.3 06/30/2017   Lab Results  Component Value Date   INSULIN 8.5 06/30/2017   CBC    Component Value Date/Time   WBC 5.7 06/30/2017 1342   WBC 9.5 08/06/2013 0615   RBC 4.21 06/30/2017 1342   RBC 3.00 (L) 08/06/2013 0615   HGB 10.2 (L) 06/30/2017 1342   HCT 34.2 06/30/2017 1342   PLT 269 08/06/2013 0615   MCV 81 06/30/2017 1342   MCH 24.2 (L) 06/30/2017 1342   MCH 23.7 (L) 08/06/2013 0615   MCHC 29.8 (L) 06/30/2017 1342   MCHC 30.6 08/06/2013 0615   RDW 14.8 06/30/2017 1342   LYMPHSABS 2.3 06/30/2017 1342   MONOABS 0.7 11/10/2006 1643   EOSABS 0.2 06/30/2017 1342   BASOSABS 0.0 06/30/2017 1342   Iron/TIBC/Ferritin/ %Sat No results found for: IRON, TIBC, FERRITIN, IRONPCTSAT Lipid Panel     Component Value Date/Time   CHOL 161 06/30/2017 1342   TRIG 76 06/30/2017 1342   HDL 56 06/30/2017 1342   LDLCALC 90 06/30/2017 1342   Hepatic Function Panel     Component Value Date/Time   PROT 7.0 06/30/2017 1342   ALBUMIN 4.2 06/30/2017 1342   AST 13 06/30/2017 1342   ALT 10 06/30/2017 1342   ALKPHOS 85 06/30/2017 1342   BILITOT 0.3 06/30/2017 1342      Component Value Date/Time   TSH 0.902 06/30/2017 1342   TSH 1.268 11/10/2006 1604   Results for Jenny, Porter (MRN [id number]) as of 09/13/2017 09:54  Ref. Range 06/30/2017 13:42  Vitamin D, 25-Hydroxy Latest Ref Range: 30.0 - 100.0 ng/mL 21.5 (L)   ASSESSMENT AND PLAN: Vitamin D deficiency  Iron deficiency anemia secondary to inadequate dietary iron intake - Plan: multivitamin-iron-minerals-folic acid (CENTRUM) chewable tablet  At risk for osteoporosis  Class 2 severe obesity with serious comorbidity and body mass index (BMI) of 35.0 to 35.9 in adult, unspecified obesity type  (HCC)  PLAN:  Vitamin D Deficiency Jenny Porter was informed that low vitamin D levels contributes to fatigue and are associated with obesity, breast, and colon cancer. She agrees to pick up her prescription Vit D @50 ,000 IU every week and begin taking immediately. We will recheck labs in 3 months and she will follow up for routine testing of vitamin D, at least 2-3 times per year. She was informed of the risk of over-replacement of vitamin D and agrees to not increase her dose unless she discusses this with [place] first.  At risk for osteopenia and osteoporosis Nickole is at risk for osteopenia and osteoporsis due to her vitamin D deficiency. She was encouraged to take her vitamin  D and follow her higher calcium diet and increase strengthening exercise to help strengthen her bones and decrease her risk of osteopenia and osteoporosis.  Iron Deficiency Anemia The diagnosis of Iron deficiency anemia was discussed with Israella and was explained in detail. She was given suggestions of iron rich foods and she agreed to begin iron supplement or OTC PN vitamins. Mycah agrees to follow up as directed.  Obesity Xin is currently in the action stage of change. As such, her goal is to continue with weight loss efforts She has agreed to keep a food journal with goal of 1300 calories and 80 to 90 grams of protein daily for lunch only and follow the Category 2 plan +100 calorie snack Talyn has been instructed to work up to a goal of 150 minutes of combined cardio and strengthening exercise per week or continue walking 50 minutes 3 times per week and Tae Kwon Do class 1 to 2 times per week for weight loss and overall health benefits. We discussed the following Behavioral Modification Strategies today: increasing lean protein intake, increase H2O intake, better snacking choices and keep a strict food journal  Jenny Porter has agreed to follow up with our clinic in 2 to 3 weeks. She was informed of the importance of frequent  follow up visits to maximize her success with intensive lifestyle modifications for her multiple health conditions.   OBESITY BEHAVIORAL INTERVENTION VISIT  Today's visit was # 4 out of 22. Starting weight: 198 lbs Starting date: 06/30/17 Today's weight : 196 lbs  Today's date: 09/13/2017 Total lbs lost to date: 2    ASK: We discussed the diagnosis of obesity with Chaya JanEstela Porter Acosta today and Jenny Porter agreed to give Jenny Porter permission to discuss obesity behavioral modification therapy today.  ASSESS: Jenny Porter has the diagnosis of obesity and her BMI today is 35.84 Donielle is in the action stage of change   ADVISE: Jenny Porter was educated on the multiple health risks of obesity as well as the benefit of weight loss to improve her health. She was advised of the need for long term treatment and the importance of lifestyle modifications.  AGREE: Multiple dietary modification options and treatment options were discussed and  Breahna agreed to the above obesity treatment plan.  Cristi LoronI, Joanne Murray, am acting as Energy managertranscriptionist for [employer] Energy[employer]el Brown, DO.   I have reviewed the above documentation for accuracy and completeness, and I agree with the above. -Corinna CapraAngel Brown, DO

## 2017-09-28 ENCOUNTER — Ambulatory Visit (INDEPENDENT_AMBULATORY_CARE_PROVIDER_SITE_OTHER): Payer: 59 | Admitting: Family Medicine

## 2017-09-28 VITALS — BP 109/74 | HR 60 | Temp 97.9°F | Ht 62.0 in | Wt 196.0 lb

## 2017-09-28 DIAGNOSIS — E559 Vitamin D deficiency, unspecified: Secondary | ICD-10-CM | POA: Diagnosis not present

## 2017-09-28 DIAGNOSIS — Z9189 Other specified personal risk factors, not elsewhere classified: Secondary | ICD-10-CM

## 2017-09-28 DIAGNOSIS — D500 Iron deficiency anemia secondary to blood loss (chronic): Secondary | ICD-10-CM | POA: Diagnosis not present

## 2017-09-28 DIAGNOSIS — Z6836 Body mass index (BMI) 36.0-36.9, adult: Secondary | ICD-10-CM

## 2017-09-28 MED ORDER — VITAMIN D (ERGOCALCIFEROL) 1.25 MG (50000 UNIT) PO CAPS: 50000.0000 [IU] | ORAL_CAPSULE | ORAL | 0 refills | Status: DC

## 2017-09-29 NOTE — Progress Notes (Signed)
Office: [phone]  /  Fax: [phone]   HPI:   Chief Complaint: OBESITY Jenny Porter is here to discuss her progress with her obesity treatment plan. She is on the Category 2 plan +100 calorie snack and is following her eating plan approximately 90 % of the time. She states she is doing [employer] Do 60 minutes 4 times per week. Jenny Porter is finding the week that she isn't working, to be difficult as she eats out frequently and also her third meal at home (which is her lunch portion) to be difficult. Her weight is 196 lb (88.9 kg) today and she has maintained weight over a period of 2 weeks since her last visit. She has lost 2 lbs since starting treatment with [place].  Vitamin D deficiency Jenny Porter has a diagnosis of vitamin D deficiency. Jenny Porter is currently taking vit D and she admits fatigue, but denies nausea, vomiting or muscle weakness.  At risk for osteopenia and osteoporosis Jenny Porter is at higher risk of osteopenia and osteoporosis due to vitamin D deficiency.   Anemia (blood loss) Jenny Porter has a diagnosis of anemia. Prior to having labs drawn, Jenny Porter had just recently donated blood. Jenny Porter is not on iron supplementation.   ALLERGIES: No Known Allergies  MEDICATIONS: Current Outpatient Medications on File Prior to Visit  Medication Sig Dispense Refill  . multivitamin-iron-minerals-folic acid (CENTRUM) chewable tablet Chew 1 tablet by mouth daily.     No current facility-administered medications on file prior to visit.     PAST MEDICAL HISTORY: Past Medical History:  Diagnosis Date  . Anemia   . Asthma    rare inhaler use  . Heartburn in pregnancy   . No pertinent past medical history   . Posterior tibial tendinitis     PAST SURGICAL HISTORY: Past Surgical History:  Procedure Laterality Date  . CESAREAN SECTION    . CESAREAN SECTION  06/15/2011   Procedure: CESAREAN SECTION;  Surgeon: Zelphia Cairo, MD;  Location: [facility];  Service: Gynecology;  Laterality: N/A;  REPEAT EDC  5/25  . CESAREAN SECTION WITH BILATERAL TUBAL LIGATION N/A 08/05/2013   Procedure: REPEAT CESAREAN SECTION WITH BILATERAL TUBAL LIGATION;  Surgeon: Zelphia Cairo, MD;  Location: [facility];  Service: Obstetrics;  Laterality: N/A;  . DILATION AND CURETTAGE OF UTERUS      SOCIAL HISTORY: Social History   Tobacco Use  . Smoking status: Never Smoker  . Smokeless tobacco: Never Used  Substance Use Topics  . Alcohol use: No  . Drug use: No    FAMILY HISTORY: Family History  Problem Relation Age of Onset  . Heart disease Paternal Grandmother   . Obesity Mother   . Obesity Father   . Anesthesia problems Neg Hx   . Hypotension Neg Hx   . Pseudochol deficiency Neg Hx   . Malignant hyperthermia Neg Hx     ROS: Review of Systems  Constitutional: Positive for malaise/fatigue. Negative for weight loss.  Gastrointestinal: Negative for nausea and vomiting.  Musculoskeletal:       Negative for muscle weakness    PHYSICAL EXAM: Blood pressure 109/74, pulse 60, temperature 97.9 F (36.6 C), temperature source Oral, height 5\' 2"  (1.575 m), weight 196 lb (88.9 kg), SpO2 99 %, currently breastfeeding. Body mass index is 35.85 kg/m. Physical Exam  Constitutional: She is oriented to person, place, and time. She appears well-developed and well-nourished.  Cardiovascular: Normal rate.  Pulmonary/Chest: Effort normal.  Musculoskeletal: Normal range of motion.  Neurological: She is oriented to person,  place, and time.  Skin: Skin is warm and dry.  Psychiatric: She has a normal mood and affect. Her behavior is normal.  Vitals reviewed.   RECENT LABS AND TESTS: BMET    Component Value Date/Time   NA 137 06/30/2017 1342   K 4.4 06/30/2017 1342   CL 102 06/30/2017 1342   CO2 24 06/30/2017 1342   GLUCOSE 84 06/30/2017 1342   GLUCOSE 100 (H) 11/10/2006 1604   BUN 10 06/30/2017 1342   CREATININE 0.66 06/30/2017 1342   CALCIUM 9.3 06/30/2017 1342   GFRNONAA 112 06/30/2017 1342   GFRAA 129  06/30/2017 1342   Lab Results  Component Value Date   HGBA1C 5.3 06/30/2017   Lab Results  Component Value Date   INSULIN 8.5 06/30/2017   CBC    Component Value Date/Time   WBC 5.7 06/30/2017 1342   WBC 9.5 08/06/2013 0615   RBC 4.21 06/30/2017 1342   RBC 3.00 (L) 08/06/2013 0615   HGB 10.2 (L) 06/30/2017 1342   HCT 34.2 06/30/2017 1342   PLT 269 08/06/2013 0615   MCV 81 06/30/2017 1342   MCH 24.2 (L) 06/30/2017 1342   MCH 23.7 (L) 08/06/2013 0615   MCHC 29.8 (L) 06/30/2017 1342   MCHC 30.6 08/06/2013 0615   RDW 14.8 06/30/2017 1342   LYMPHSABS 2.3 06/30/2017 1342   MONOABS 0.7 11/10/2006 1643   EOSABS 0.2 06/30/2017 1342   BASOSABS 0.0 06/30/2017 1342   Iron/TIBC/Ferritin/ %Sat No results found for: IRON, TIBC, FERRITIN, IRONPCTSAT Lipid Panel     Component Value Date/Time   CHOL 161 06/30/2017 1342   TRIG 76 06/30/2017 1342   HDL 56 06/30/2017 1342   LDLCALC 90 06/30/2017 1342   Hepatic Function Panel     Component Value Date/Time   PROT 7.0 06/30/2017 1342   ALBUMIN 4.2 06/30/2017 1342   AST 13 06/30/2017 1342   ALT 10 06/30/2017 1342   ALKPHOS 85 06/30/2017 1342   BILITOT 0.3 06/30/2017 1342      Component Value Date/Time   TSH 0.902 06/30/2017 1342   TSH 1.268 11/10/2006 1604   Results for KAMYLAH, MANZO (MRN [id number]) as of 09/29/2017 07:35  Ref. Range 06/30/2017 13:42  Vitamin D, 25-Hydroxy Latest Ref Range: 30.0 - 100.0 ng/mL 21.5 (L)   ASSESSMENT AND PLAN: Vitamin D deficiency - Plan: Vitamin D, Ergocalciferol, (DRISDOL) 50000 units CAPS capsule  Iron deficiency anemia due to chronic blood loss  At risk for osteoporosis  Class 2 severe obesity with serious comorbidity and body mass index (BMI) of 36.0 to 36.9 in adult, unspecified obesity type (HCC)  PLAN:  Vitamin D Deficiency Jenny Porter was informed that low vitamin D levels contributes to fatigue and are associated with obesity, breast, and colon cancer. She agrees to continue to  take prescription Vit D @50 ,000 IU every week #4 with no refills and will follow up for routine testing of vitamin D, at least 2-3 times per year. She was informed of the risk of over-replacement of vitamin D and agrees to not increase her dose unless she discusses this with [place] first. We will repeat labs in 2 months and Chela agreed to follow up as directed.  At risk for osteopenia and osteoporosis Aaliah was given extended  (15 minutes) osteoporosis prevention counseling today. Syvanna is at risk for osteopenia and osteoporosis due to her vitamin D deficiency. She was encouraged to take her vitamin D and follow her higher calcium diet and increase strengthening exercise to help  strengthen her bones and decrease her risk of osteopenia and osteoporosis.  Anemia (blood loss) The diagnosis of Acute Blood Loss Anemia was discussed with Adrieana and was explained in detail. She was given suggestions of iron rich foods and iron supplement was not prescribed. Repeat CBC will be rechecked at the time of her next labs..  Obesity Jadore is currently in the action stage of change. As such, her goal is to continue with weight loss efforts She has agreed to follow the Category 2 plan +100 calories Ellajean has been instructed to work up to a goal of 150 minutes of combined cardio and strengthening exercise per week for weight loss and overall health benefits. We discussed the following Behavioral Modification Strategies today: planning for success, increasing lean protein intake, increasing vegetables and work on meal planning and easy cooking plans  Judith has agreed to follow up with our clinic in 2 to 3 weeks. She was informed of the importance of frequent follow up visits to maximize her success with intensive lifestyle modifications for her multiple health conditions.   OBESITY BEHAVIORAL INTERVENTION VISIT  Today's visit was # 5   Starting weight: 198 lbs Starting date: 06/30/17 Today's weight : 196 lbs    Today's date: 09/28/2017 Total lbs lost to date: 2   ASK: We discussed the diagnosis of obesity with Chaya Jan today and Kierstan agreed to give [place] permission to discuss obesity behavioral modification therapy today.  ASSESS: Idalis has the diagnosis of obesity and her BMI today is 35.84 [place] is in the action stage of change   ADVISE: Kerbi was educated on the multiple health risks of obesity as well as the benefit of weight loss to improve her health. She was advised of the need for long term treatment and the importance of lifestyle modifications to improve her current health and to decrease her risk of future health problems.  AGREE: Multiple dietary modification options and treatment options were discussed and  Lakira agreed to follow the recommendations documented in the above note.  ARRANGE: Namiyah was educated on the importance of frequent visits to treat obesity as outlined per CMS and USPSTF guidelines and agreed to schedule her next follow up appointment today.  I, [place] Crane, am acting as transcriptionist for Filbert Schilder, MD  I have reviewed the above documentation for accuracy and completeness, and I agree with the above. - Debbra Riding, MD

## 2017-10-12 ENCOUNTER — Ambulatory Visit (INDEPENDENT_AMBULATORY_CARE_PROVIDER_SITE_OTHER): Payer: 59 | Admitting: Family Medicine

## 2017-10-12 VITALS — BP 116/78 | HR 94 | Temp 98.7°F | Ht 62.0 in | Wt 192.0 lb

## 2017-10-12 DIAGNOSIS — Z9189 Other specified personal risk factors, not elsewhere classified: Secondary | ICD-10-CM | POA: Diagnosis not present

## 2017-10-12 DIAGNOSIS — E559 Vitamin D deficiency, unspecified: Secondary | ICD-10-CM

## 2017-10-12 DIAGNOSIS — D508 Other iron deficiency anemias: Secondary | ICD-10-CM

## 2017-10-12 DIAGNOSIS — Z6835 Body mass index (BMI) 35.0-35.9, adult: Secondary | ICD-10-CM

## 2017-10-12 MED ORDER — VITAMIN D (ERGOCALCIFEROL) 1.25 MG (50000 UNIT) PO CAPS: 50000.0000 [IU] | ORAL_CAPSULE | ORAL | 0 refills | Status: DC

## 2017-10-13 NOTE — Progress Notes (Signed)
Office: [phone]  /  Fax: [phone]   HPI:   Chief Complaint: OBESITY Jenny Porter is here to discuss her progress with her obesity treatment plan. She is on the Keto and is following her eating plan approximately 100 % of the time. She states she is doing [employer] do for 60 minutes 3 times per week. Jenny Porter is doing the Keto diet. She notes her hunger is controlled. She got over hump and into Ketosis. Unsure how Keto will be during work week.  Her weight is 192 lb (87.1 kg) today and has had a weight loss of 4 pounds over a period of 2 weeks since her last visit. She has lost 6 lbs since starting treatment with Jenny Porter.  Vitamin D Deficiency Jenny Porter has a diagnosis of vitamin D deficiency. She is currently taking prescription Vit D. She notes fatigue and denies nausea, vomiting or muscle weakness.  At risk for osteopenia and osteoporosis Jenny Porter is at higher risk of osteopenia and osteoporosis due to vitamin D deficiency.   Iron Deficiency Anemia Jenny Porter has a diagnosis of anemia. She has recently donated blood. She is on iron supplementation.    ALLERGIES: No Known Allergies  MEDICATIONS: Current Outpatient Medications on File Prior to Visit  Medication Sig Dispense Refill  . multivitamin-iron-minerals-folic acid (CENTRUM) chewable tablet Chew 1 tablet by mouth daily.     No current facility-administered medications on file prior to visit.     PAST MEDICAL HISTORY: Past Medical History:  Diagnosis Date  . Anemia   . Asthma    rare inhaler use  . Heartburn in pregnancy   . No pertinent past medical history   . Posterior tibial tendinitis     PAST SURGICAL HISTORY: Past Surgical History:  Procedure Laterality Date  . CESAREAN SECTION    . CESAREAN SECTION  06/15/2011   Procedure: CESAREAN SECTION;  Surgeon: Jenny CairoGretchen Adkins, MD;  Location: [facility];  Service: Gynecology;  Laterality: N/A;  REPEAT EDC 5/25  . CESAREAN SECTION WITH BILATERAL TUBAL LIGATION N/A 08/05/2013   Procedure: REPEAT CESAREAN SECTION WITH BILATERAL TUBAL LIGATION;  Surgeon: Jenny CairoGretchen Adkins, MD;  Location: [facility];  Service: Obstetrics;  Laterality: N/A;  . DILATION AND CURETTAGE OF UTERUS      SOCIAL HISTORY: Social History   Tobacco Use  . Smoking status: Never Smoker  . Smokeless tobacco: Never Used  Substance Use Topics  . Alcohol use: No  . Drug use: No    FAMILY HISTORY: Family History  Problem Relation Age of Onset  . Heart disease Paternal Grandmother   . Obesity Mother   . Obesity Father   . Anesthesia problems Neg Hx   . Hypotension Neg Hx   . Pseudochol deficiency Neg Hx   . Malignant hyperthermia Neg Hx     ROS: Review of Systems  Constitutional: Positive for malaise/fatigue and weight loss.  Gastrointestinal: Negative for nausea and vomiting.  Musculoskeletal:       Negative muscle weakness    PHYSICAL EXAM: Blood pressure 116/78, pulse 94, temperature 98.7 F (37.1 C), temperature source Oral, height 5\' 2"  (1.575 m), weight 192 lb (87.1 kg), SpO2 100 %, currently breastfeeding. Body mass index is 35.12 kg/m. Physical Exam  Constitutional: She is oriented to person, place, and time. She appears well-developed and well-nourished.  Cardiovascular: Normal rate.  Pulmonary/Chest: Effort normal.  Musculoskeletal: Normal range of motion.  Neurological: She is oriented to person, place, and time.  Skin: Skin is warm and dry.  Psychiatric: She  has a normal mood and affect. Her behavior is normal.  Vitals reviewed.   RECENT LABS AND TESTS: BMET    Component Value Date/Time   NA 137 06/30/2017 1342   K 4.4 06/30/2017 1342   CL 102 06/30/2017 1342   CO2 24 06/30/2017 1342   GLUCOSE 84 06/30/2017 1342   GLUCOSE 100 (H) 11/10/2006 1604   BUN 10 06/30/2017 1342   CREATININE 0.66 06/30/2017 1342   CALCIUM 9.3 06/30/2017 1342   GFRNONAA 112 06/30/2017 1342   GFRAA 129 06/30/2017 1342   Lab Results  Component Value Date   HGBA1C 5.3 06/30/2017    Lab Results  Component Value Date   INSULIN 8.5 06/30/2017   CBC    Component Value Date/Time   WBC 5.7 06/30/2017 1342   WBC 9.5 08/06/2013 0615   RBC 4.21 06/30/2017 1342   RBC 3.00 (L) 08/06/2013 0615   HGB 10.2 (L) 06/30/2017 1342   HCT 34.2 06/30/2017 1342   PLT 269 08/06/2013 0615   MCV 81 06/30/2017 1342   MCH 24.2 (L) 06/30/2017 1342   MCH 23.7 (L) 08/06/2013 0615   MCHC 29.8 (L) 06/30/2017 1342   MCHC 30.6 08/06/2013 0615   RDW 14.8 06/30/2017 1342   LYMPHSABS 2.3 06/30/2017 1342   MONOABS 0.7 11/10/2006 1643   EOSABS 0.2 06/30/2017 1342   BASOSABS 0.0 06/30/2017 1342   Iron/TIBC/Ferritin/ %Sat No results found for: IRON, TIBC, FERRITIN, IRONPCTSAT Lipid Panel     Component Value Date/Time   CHOL 161 06/30/2017 1342   TRIG 76 06/30/2017 1342   HDL 56 06/30/2017 1342   LDLCALC 90 06/30/2017 1342   Hepatic Function Panel     Component Value Date/Time   PROT 7.0 06/30/2017 1342   ALBUMIN 4.2 06/30/2017 1342   AST 13 06/30/2017 1342   ALT 10 06/30/2017 1342   ALKPHOS 85 06/30/2017 1342   BILITOT 0.3 06/30/2017 1342      Component Value Date/Time   TSH 0.902 06/30/2017 1342   TSH 1.268 11/10/2006 1604  Results for Jenny Porter, Jenny Porter (MRN [id number]) as of 10/13/2017 10:06  Ref. Range 06/30/2017 13:42  Vitamin D, 25-Hydroxy Latest Ref Range: 30.0 - 100.0 ng/mL 21.5 (L)    ASSESSMENT AND PLAN: Vitamin D deficiency - Plan: Vitamin D, Ergocalciferol, (DRISDOL) 50000 units CAPS capsule  Other iron deficiency anemia  At risk for osteoporosis  Class 2 severe obesity with serious comorbidity and body mass index (BMI) of 35.0 to 35.9 in adult, unspecified obesity type (HCC)  PLAN:  Vitamin D Deficiency Jenny Porter was informed that low vitamin D levels contributes to fatigue and are associated with obesity, breast, and colon cancer. Jenny Porter agrees to continue taking prescription Vit D @50 ,000 IU every week #4 and we will refill for 1 month. She will follow  up for routine testing of vitamin D, at least 2-3 times per year. She was informed of the risk of over-replacement of vitamin D and agrees to not increase her dose unless she discusses this with [place] first. Jenny Porter agrees to follow up with our clinic in 2 weeks.  At risk for osteopenia and osteoporosis Jenny Porter was given extended  (15 minutes) osteoporosis prevention counseling today. Jenny Porter is at risk for osteopenia and osteoporsis due to her vitamin D deficiency. She was encouraged to take her vitamin D and follow her higher calcium diet and increase strengthening exercise to help strengthen her bones and decrease her risk of osteopenia and osteoporosis.  Iron Deficiency Anemia The diagnosis of  Iron deficiency anemia was discussed with Jenny Porter and was explained in detail. She was given suggestions of iron rich foods and iron supplement was not prescribed. We will recheck CBC at next labs. Jenny Porter agrees to follow up with our clinic in 2 weeks.  Obesity Jenny Porter is currently in the action stage of change. As such, her goal is to continue with weight loss efforts She has agreed to follow a lower carbohydrate, vegetable and lean protein rich diet plan Jenny Porter has been instructed to work up to a goal of 150 minutes of combined cardio and strengthening exercise per week for weight loss and overall health benefits. We discussed the following Behavioral Modification Strategies today: increasing lean protein intake, increasing vegetables, work on meal planning and easy cooking plans, and planning for success   Jenny Porter has agreed to follow up with our clinic in 2 weeks. She was informed of the importance of frequent follow up visits to maximize her success with intensive lifestyle modifications for her multiple health conditions.   OBESITY BEHAVIORAL INTERVENTION VISIT  Today's visit was # 6   Starting weight: 198 lbs Starting date: 06/30/17 Today's weight : 192 lbs  Today's date: 10/12/2017 Total lbs lost to  date: 6    ASK: We discussed the diagnosis of obesity with Jenny Porter today and Jenny Porter agreed to give [place] permission to discuss obesity behavioral modification therapy today.  ASSESS: Jenny Porter has the diagnosis of obesity and her BMI today is 35.11 Jenny Porter is in the action stage of change   ADVISE: Jenny Porter was educated on the multiple health risks of obesity as well as the benefit of weight loss to improve her health. She was advised of the need for long term treatment and the importance of lifestyle modifications to improve her current health and to decrease her risk of future health problems.  AGREE: Multiple dietary modification options and treatment options were discussed and  Jenny Porter agreed to follow the recommendations documented in the above note.  ARRANGE: Jenny Porter was educated on the importance of frequent visits to treat obesity as outlined per CMS and USPSTF guidelines and agreed to schedule her next follow up appointment today.  I, Burt Knack, am acting as transcriptionist for Debbra Riding, MD  I have reviewed the above documentation for accuracy and completeness, and I agree with the above. - Debbra Riding, MD

## 2017-10-26 ENCOUNTER — Ambulatory Visit (INDEPENDENT_AMBULATORY_CARE_PROVIDER_SITE_OTHER): Payer: 59 | Admitting: Family Medicine

## 2017-10-26 VITALS — BP 101/69 | HR 66 | Temp 97.8°F | Ht 62.0 in | Wt 191.0 lb

## 2017-10-26 DIAGNOSIS — E559 Vitamin D deficiency, unspecified: Secondary | ICD-10-CM

## 2017-10-26 DIAGNOSIS — Z6835 Body mass index (BMI) 35.0-35.9, adult: Secondary | ICD-10-CM | POA: Diagnosis not present

## 2017-10-26 NOTE — Progress Notes (Signed)
Office: [phone]  /  Fax: [phone]   HPI:   Chief Complaint: OBESITY Jenny Porter is here to discuss her progress with her obesity treatment plan. She was advised to follow the lower carbohydrate, vegetable and lean protein rich diet plan, but she is following the Keto plan and she is following her eating plan approximately 100 % of the time. She states she is doing [employer] Do 60 minutes 3 times per week. Jenny Porter is doing well on keto and she is not necessarily feeling the elevation of ketosis. Her weight is 191 lb (86.6 kg) today and has had a weight loss of 1 pound over a period of 2 weeks since her last visit. She has lost 7 lbs since starting treatment with [place].  Vitamin D deficiency Jenny Porter has a diagnosis of vitamin D deficiency. Jenny Porter is currently taking vit D and she admits to fatigue and denies nausea, vomiting or muscle weakness.  ALLERGIES: No Known Allergies  MEDICATIONS: Current Outpatient Medications on File Prior to Visit  Medication Sig Dispense Refill  . multivitamin-iron-minerals-folic acid (CENTRUM) chewable tablet Chew 1 tablet by mouth daily.    . Vitamin D, Ergocalciferol, (DRISDOL) 50000 units CAPS capsule Take 1 capsule (50,000 Units total) by mouth every 7 (seven) days. 4 capsule 0   No current facility-administered medications on file prior to visit.     PAST MEDICAL HISTORY: Past Medical History:  Diagnosis Date  . Anemia   . Asthma    rare inhaler use  . Heartburn in pregnancy   . No pertinent past medical history   . Posterior tibial tendinitis     PAST SURGICAL HISTORY: Past Surgical History:  Procedure Laterality Date  . CESAREAN SECTION    . CESAREAN SECTION  06/15/2011   Procedure: CESAREAN SECTION;  Surgeon: Zelphia Cairo, MD;  Location: [facility];  Service: Gynecology;  Laterality: N/A;  REPEAT EDC 5/25  . CESAREAN SECTION WITH BILATERAL TUBAL LIGATION N/A 08/05/2013   Procedure: REPEAT CESAREAN SECTION WITH BILATERAL TUBAL LIGATION;   Surgeon: Zelphia Cairo, MD;  Location: [facility];  Service: Obstetrics;  Laterality: N/A;  . DILATION AND CURETTAGE OF UTERUS      SOCIAL HISTORY: Social History   Tobacco Use  . Smoking status: Never Smoker  . Smokeless tobacco: Never Used  Substance Use Topics  . Alcohol use: No  . Drug use: No    FAMILY HISTORY: Family History  Problem Relation Age of Onset  . Heart disease Paternal Grandmother   . Obesity Mother   . Obesity Father   . Anesthesia problems Neg Hx   . Hypotension Neg Hx   . Pseudochol deficiency Neg Hx   . Malignant hyperthermia Neg Hx     ROS: Review of Systems  Constitutional: Positive for malaise/fatigue and weight loss.  Gastrointestinal: Negative for nausea and vomiting.  Musculoskeletal:       Negative for muscle weakness    PHYSICAL EXAM: Blood pressure 101/69, pulse 66, temperature 97.8 F (36.6 C), temperature source Oral, height 5\' 2"  (1.575 m), weight 191 lb (86.6 kg), SpO2 98 %, currently breastfeeding. Body mass index is 34.93 kg/m. Physical Exam  Constitutional: She is oriented to person, place, and time. She appears well-developed and well-nourished.  Cardiovascular: Normal rate.  Pulmonary/Chest: Effort normal.  Musculoskeletal: Normal range of motion.  Neurological: She is oriented to person, place, and time.  Skin: Skin is warm and dry.  Psychiatric: She has a normal mood and affect. Her behavior is normal.  Vitals reviewed.   RECENT LABS AND TESTS: BMET    Component Value Date/Time   NA 137 06/30/2017 1342   K 4.4 06/30/2017 1342   CL 102 06/30/2017 1342   CO2 24 06/30/2017 1342   GLUCOSE 84 06/30/2017 1342   GLUCOSE 100 (H) 11/10/2006 1604   BUN 10 06/30/2017 1342   CREATININE 0.66 06/30/2017 1342   CALCIUM 9.3 06/30/2017 1342   GFRNONAA 112 06/30/2017 1342   GFRAA 129 06/30/2017 1342   Lab Results  Component Value Date   HGBA1C 5.3 06/30/2017   Lab Results  Component Value Date   INSULIN 8.5 06/30/2017    CBC    Component Value Date/Time   WBC 5.7 06/30/2017 1342   WBC 9.5 08/06/2013 0615   RBC 4.21 06/30/2017 1342   RBC 3.00 (L) 08/06/2013 0615   HGB 10.2 (L) 06/30/2017 1342   HCT 34.2 06/30/2017 1342   PLT 269 08/06/2013 0615   MCV 81 06/30/2017 1342   MCH 24.2 (L) 06/30/2017 1342   MCH 23.7 (L) 08/06/2013 0615   MCHC 29.8 (L) 06/30/2017 1342   MCHC 30.6 08/06/2013 0615   RDW 14.8 06/30/2017 1342   LYMPHSABS 2.3 06/30/2017 1342   MONOABS 0.7 11/10/2006 1643   EOSABS 0.2 06/30/2017 1342   BASOSABS 0.0 06/30/2017 1342   Iron/TIBC/Ferritin/ %Sat No results found for: IRON, TIBC, FERRITIN, IRONPCTSAT Lipid Panel     Component Value Date/Time   CHOL 161 06/30/2017 1342   TRIG 76 06/30/2017 1342   HDL 56 06/30/2017 1342   LDLCALC 90 06/30/2017 1342   Hepatic Function Panel     Component Value Date/Time   PROT 7.0 06/30/2017 1342   ALBUMIN 4.2 06/30/2017 1342   AST 13 06/30/2017 1342   ALT 10 06/30/2017 1342   ALKPHOS 85 06/30/2017 1342   BILITOT 0.3 06/30/2017 1342      Component Value Date/Time   TSH 0.902 06/30/2017 1342   TSH 1.268 11/10/2006 1604   Results for LURAE, HORNBROOK (MRN [id number]) as of 10/26/2017 09:39  Ref. Range 06/30/2017 13:42  Vitamin D, 25-Hydroxy Latest Ref Range: 30.0 - 100.0 ng/mL 21.5 (L)   ASSESSMENT AND PLAN: Vitamin D deficiency  Class 2 severe obesity with serious comorbidity and body mass index (BMI) of 35.0 to 35.9 in adult, unspecified obesity type (HCC)  PLAN:  Vitamin D Deficiency Jenny Porter was informed that low vitamin D levels contributes to fatigue and are associated with obesity, breast, and colon cancer. She agrees to continue to take prescription Vit D @50 ,000 IU every week (no refill needed) and will follow up for routine testing of vitamin D, at least 2-3 times per year. She was informed of the risk of over-replacement of vitamin D and agrees to not increase her dose unless she discusses this with [place] first.  I  spent > than 50% of the 15 minute visit on counseling as documented in the note.  Obesity Jenny Porter is currently in the action stage of change. As such, her goal is to continue with weight loss efforts She has agreed to follow a lower carbohydrate, vegetable and lean protein rich diet plan Jenny Porter has been instructed to work up to a goal of 150 minutes of combined cardio and strengthening exercise per week for weight loss and overall health benefits. We discussed the following Behavioral Modification Strategies today: better snacking choices, planning for success, increasing lean protein intake, increasing vegetables and work on meal planning and easy cooking plans  Jenny Porter has agreed  to follow up with our clinic in 3 weeks. She was informed of the importance of frequent follow up visits to maximize her success with intensive lifestyle modifications for her multiple health conditions.   OBESITY BEHAVIORAL INTERVENTION VISIT  Today's visit was # 7   Starting weight: 198 lbs Starting date: 06/30/17 Today's weight : 191 lbs Today's date: 10/26/2017 Total lbs lost to date: 7   ASK: We discussed the diagnosis of obesity with Jenny Porter today and Jenny Porter agreed to give [place] permission to discuss obesity behavioral modification therapy today.  ASSESS: Mylasia has the diagnosis of obesity and her BMI today is 34.93 Jenny Porter is in the action stage of change   ADVISE: Jenny Porter was educated on the multiple health risks of obesity as well as the benefit of weight loss to improve her health. She was advised of the need for long term treatment and the importance of lifestyle modifications to improve her current health and to decrease her risk of future health problems.  AGREE: Multiple dietary modification options and treatment options were discussed and  Jenny Porter agreed to follow the recommendations documented in the above note.  ARRANGE: Jenny Porter was educated on the importance of frequent visits to  treat obesity as outlined per CMS and USPSTF guidelines and agreed to schedule her next follow up appointment today.  I, [place] Crane, am acting as transcriptionist for Filbert Schilder, MD  I have reviewed the above documentation for accuracy and completeness, and I agree with the above. - Debbra Riding, MD

## 2017-11-17 ENCOUNTER — Ambulatory Visit (INDEPENDENT_AMBULATORY_CARE_PROVIDER_SITE_OTHER): Payer: 59 | Admitting: Family Medicine

## 2017-11-17 VITALS — BP 106/77 | HR 65 | Temp 97.9°F | Ht 62.0 in | Wt 189.0 lb

## 2017-11-17 DIAGNOSIS — E669 Obesity, unspecified: Secondary | ICD-10-CM

## 2017-11-17 DIAGNOSIS — E559 Vitamin D deficiency, unspecified: Secondary | ICD-10-CM | POA: Diagnosis not present

## 2017-11-17 DIAGNOSIS — Z6834 Body mass index (BMI) 34.0-34.9, adult: Secondary | ICD-10-CM

## 2017-11-17 DIAGNOSIS — D649 Anemia, unspecified: Secondary | ICD-10-CM | POA: Diagnosis not present

## 2017-11-17 DIAGNOSIS — Z9189 Other specified personal risk factors, not elsewhere classified: Secondary | ICD-10-CM | POA: Diagnosis not present

## 2017-11-18 LAB — CBC WITH DIFFERENTIAL
BASOS: 1 %
Basophils Absolute: 0.1 10*3/uL (ref 0.0–0.2)
EOS (ABSOLUTE): 0.2 10*3/uL (ref 0.0–0.4)
EOS: 4 %
HEMATOCRIT: 37.1 % (ref 34.0–46.6)
HEMOGLOBIN: 12.1 g/dL (ref 11.1–15.9)
IMMATURE GRANS (ABS): 0 10*3/uL (ref 0.0–0.1)
Immature Granulocytes: 0 %
LYMPHS ABS: 2.1 10*3/uL (ref 0.7–3.1)
Lymphs: 33 %
MCH: 25.6 pg — AB (ref 26.6–33.0)
MCHC: 32.6 g/dL (ref 31.5–35.7)
MCV: 78 fL — AB (ref 79–97)
MONOCYTES: 7 %
Monocytes Absolute: 0.5 10*3/uL (ref 0.1–0.9)
NEUTROS ABS: 3.4 10*3/uL (ref 1.4–7.0)
Neutrophils: 55 %
RBC: 4.73 x10E6/uL (ref 3.77–5.28)
RDW: 15.6 % — ABNORMAL HIGH (ref 12.3–15.4)
WBC: 6.2 10*3/uL (ref 3.4–10.8)

## 2017-11-18 LAB — VITAMIN D 25 HYDROXY (VIT D DEFICIENCY, FRACTURES): Vit D, 25-Hydroxy: 37.7 ng/mL (ref 30.0–100.0)

## 2017-11-22 NOTE — Progress Notes (Signed)
Office: [phone]  /  Fax: [phone]   HPI:   Chief Complaint: OBESITY Jenny Porter is here to discuss her progress with her obesity treatment plan. She is on the Keto diet and is following her eating plan approximately 90 % of the time. She states she is doing [employer] Do 60 minutes 3 times per week. Jenny Porter has done well in the past 3 weeks. She is enjoying Keto and not finding it hard.  Her weight is 189 lb (85.7 kg) today and has had a weight loss of 2 pounds over a period of 3 weeks since her last visit. She has lost 9 lbs since starting treatment with [place].  Normocytic Anemia Jenny Porter had donated blood prior to her last labs. She denies fatigue.  Vitamin D deficiency Jenny Porter has a diagnosis of vitamin D deficiency. She is currently taking vit D. She admits fatigue and denies nausea, vomiting or muscle weakness.  At risk for osteopenia and osteoporosis Jenny Porter is at higher risk of osteopenia and osteoporosis due to vitamin D deficiency.   ALLERGIES: No Known Allergies  MEDICATIONS: Current Outpatient Medications on File Prior to Visit  Medication Sig Dispense Refill  . multivitamin-iron-minerals-folic acid (CENTRUM) chewable tablet Chew 1 tablet by mouth daily.    . Vitamin D, Ergocalciferol, (DRISDOL) 50000 units CAPS capsule Take 1 capsule (50,000 Units total) by mouth every 7 (seven) days. 4 capsule 0   No current facility-administered medications on file prior to visit.     PAST MEDICAL HISTORY: Past Medical History:  Diagnosis Date  . Anemia   . Asthma    rare inhaler use  . Heartburn in pregnancy   . No pertinent past medical history   . Posterior tibial tendinitis     PAST SURGICAL HISTORY: Past Surgical History:  Procedure Laterality Date  . CESAREAN SECTION    . CESAREAN SECTION  06/15/2011   Procedure: CESAREAN SECTION;  Surgeon: Zelphia Cairo, MD;  Location: [facility];  Service: Gynecology;  Laterality: N/A;  REPEAT EDC 5/25  . CESAREAN SECTION WITH  BILATERAL TUBAL LIGATION N/A 08/05/2013   Procedure: REPEAT CESAREAN SECTION WITH BILATERAL TUBAL LIGATION;  Surgeon: Zelphia Cairo, MD;  Location: [facility];  Service: Obstetrics;  Laterality: N/A;  . DILATION AND CURETTAGE OF UTERUS      SOCIAL HISTORY: Social History   Tobacco Use  . Smoking status: Never Smoker  . Smokeless tobacco: Never Used  Substance Use Topics  . Alcohol use: No  . Drug use: No    FAMILY HISTORY: Family History  Problem Relation Age of Onset  . Heart disease Paternal Grandmother   . Obesity Mother   . Obesity Father   . Anesthesia problems Neg Hx   . Hypotension Neg Hx   . Pseudochol deficiency Neg Hx   . Malignant hyperthermia Neg Hx     ROS: Review of Systems  Constitutional: Positive for malaise/fatigue and weight loss.  Gastrointestinal: Negative for nausea and vomiting.  Musculoskeletal:       Negative for muscle weakness.    PHYSICAL EXAM: Blood pressure 106/77, pulse 65, temperature 97.9 F (36.6 C), temperature source Oral, height 5\' 2"  (1.575 m), weight 189 lb (85.7 kg), SpO2 99 %, currently breastfeeding. Body mass index is 34.57 kg/m. Physical Exam  Constitutional: She is oriented to person, place, and time. She appears well-developed and well-nourished.  Cardiovascular: Normal rate.  Pulmonary/Chest: Effort normal.  Musculoskeletal: Normal range of motion.  Neurological: She is oriented to person, place, and time.  Skin: Skin is warm and dry.  Psychiatric: She has a normal mood and affect. Her behavior is normal.  Vitals reviewed.   RECENT LABS AND TESTS: BMET    Component Value Date/Time   NA 137 06/30/2017 1342   K 4.4 06/30/2017 1342   CL 102 06/30/2017 1342   CO2 24 06/30/2017 1342   GLUCOSE 84 06/30/2017 1342   GLUCOSE 100 (H) 11/10/2006 1604   BUN 10 06/30/2017 1342   CREATININE 0.66 06/30/2017 1342   CALCIUM 9.3 06/30/2017 1342   GFRNONAA 112 06/30/2017 1342   GFRAA 129 06/30/2017 1342   Lab Results    Component Value Date   HGBA1C 5.3 06/30/2017   Lab Results  Component Value Date   INSULIN 8.5 06/30/2017   CBC    Component Value Date/Time   WBC 6.2 11/17/2017 0920   WBC 9.5 08/06/2013 0615   RBC 4.73 11/17/2017 0920   RBC 3.00 (L) 08/06/2013 0615   HGB 12.1 11/17/2017 0920   HCT 37.1 11/17/2017 0920   PLT 269 08/06/2013 0615   MCV 78 (L) 11/17/2017 0920   MCH 25.6 (L) 11/17/2017 0920   MCH 23.7 (L) 08/06/2013 0615   MCHC 32.6 11/17/2017 0920   MCHC 30.6 08/06/2013 0615   RDW 15.6 (H) 11/17/2017 0920   LYMPHSABS 2.1 11/17/2017 0920   MONOABS 0.7 11/10/2006 1643   EOSABS 0.2 11/17/2017 0920   BASOSABS 0.1 11/17/2017 0920   Iron/TIBC/Ferritin/ %Sat No results found for: IRON, TIBC, FERRITIN, IRONPCTSAT Lipid Panel     Component Value Date/Time   CHOL 161 06/30/2017 1342   TRIG 76 06/30/2017 1342   HDL 56 06/30/2017 1342   LDLCALC 90 06/30/2017 1342   Hepatic Function Panel     Component Value Date/Time   PROT 7.0 06/30/2017 1342   ALBUMIN 4.2 06/30/2017 1342   AST 13 06/30/2017 1342   ALT 10 06/30/2017 1342   ALKPHOS 85 06/30/2017 1342   BILITOT 0.3 06/30/2017 1342      Component Value Date/Time   TSH 0.902 06/30/2017 1342   TSH 1.268 11/10/2006 1604   Results for Jenny Porter, Jenny Porter (MRN [id number]) as of 11/22/2017 09:56  Ref. Range 11/17/2017 09:20  Vitamin D, 25-Hydroxy Latest Ref Range: 30.0 - 100.0 ng/mL 37.7   ASSESSMENT AND PLAN: Normocytic anemia - Plan: CBC With Differential  Vitamin D deficiency - Plan: VITAMIN D 25 Hydroxy (Vit-D Deficiency, Fractures)  At risk for osteoporosis  Class 1 obesity with serious comorbidity and body mass index (BMI) of 34.0 to 34.9 in adult, unspecified obesity type  PLAN:  Normocytic Anemia We will draw a CBC today.  Vitamin D Deficiency Jenny Porter was informed that low vitamin D levels contributes to fatigue and are associated with obesity, breast, and colon cancer. She agrees to continue to take  prescription Vit D @50 ,000 IU every week and will follow up for routine testing of vitamin D, at least 2-3 times per year. She was informed of the risk of over-replacement of vitamin D and agrees to not increase her dose unless she discusses this with [place] first. We will check her vitamin D level today and she agrees to follow up in 2 to 3 weeks.  At risk for osteopenia and osteoporosis Shelbee was given extended (15 minutes) osteoporosis prevention counseling today. Renelda is at risk for osteopenia and osteoporosis due to her vitamin D deficiency. She was encouraged to take her vitamin D and follow her higher calcium diet and increase strengthening exercise to help  strengthen her bones and decrease her risk of osteopenia and osteoporosis.  Obesity Drue is currently in the action stage of change. As such, her goal is to continue with weight loss efforts. She has agreed to follow the Keto Diet. Marvella has been instructed to work up to a goal of 150 minutes of combined cardio and strengthening exercise per week for weight loss and overall health benefits. We discussed the following Behavioral Modification Strategies today: increasing lean protein intake, increasing vegetables, work on meal planning and easy cooking plans, and planning for success.  Shiquita has agreed to follow up with our clinic in 2 to 3 weeks. She was informed of the importance of frequent follow up visits to maximize her success with intensive lifestyle modifications for her multiple health conditions.   OBESITY BEHAVIORAL INTERVENTION VISIT  Today's visit was # 8   Starting weight: 198 lbs Starting date: 06/30/17 Today's weight : Weight: 189 lb (85.7 kg)  Today's date: 11/17/2017 Total lbs lost to date: 9  ASK: We discussed the diagnosis of obesity with Chaya Jan today and Billy agreed to give [place] permission to discuss obesity behavioral modification therapy today.  ASSESS: Shantee has the diagnosis of obesity  and her BMI today is 34.56. Talar is in the action stage of change.   ADVISE: Takeya was educated on the multiple health risks of obesity as well as the benefit of weight loss to improve her health. She was advised of the need for long term treatment and the importance of lifestyle modifications to improve her current health and to decrease her risk of future health problems.  AGREE: Multiple dietary modification options and treatment options were discussed and Veronika agreed to follow the recommendations documented in the above note.  ARRANGE: Adley was educated on the importance of frequent visits to treat obesity as outlined per CMS and USPSTF guidelines and agreed to schedule her next follow up appointment today.  I, Kirke Corin, am acting as Energy manager for Filbert Schilder, MD I have reviewed the above documentation for accuracy and completeness, and I agree with the above. - Debbra Riding, MD

## 2017-12-02 ENCOUNTER — Ambulatory Visit (INDEPENDENT_AMBULATORY_CARE_PROVIDER_SITE_OTHER): Payer: 59 | Admitting: Family Medicine

## 2017-12-02 VITALS — BP 103/69 | HR 60 | Temp 97.9°F | Ht 62.0 in | Wt 189.0 lb

## 2017-12-02 DIAGNOSIS — D509 Iron deficiency anemia, unspecified: Secondary | ICD-10-CM

## 2017-12-02 DIAGNOSIS — Z6834 Body mass index (BMI) 34.0-34.9, adult: Secondary | ICD-10-CM | POA: Diagnosis not present

## 2017-12-02 DIAGNOSIS — E559 Vitamin D deficiency, unspecified: Secondary | ICD-10-CM

## 2017-12-02 DIAGNOSIS — E669 Obesity, unspecified: Secondary | ICD-10-CM | POA: Diagnosis not present

## 2017-12-06 NOTE — Progress Notes (Signed)
Office: [phone]  /  Fax: [phone]   HPI:   Chief Complaint: OBESITY Jenny Porter is here to discuss her progress with her obesity treatment plan. She is on the Keto plan and is following her eating plan approximately 90 % of the time. She states she is doing Jenny Porter for 60 minutes 3 times per week. Jenny Porter has had quite a few indulgences with [employer] peanut butter cups at [employer]. She got back on track onto a modified Keto plan.  Her weight is 189 lb (85.7 kg) today and has not lost weight since her last visit. She has lost 9 lbs since starting treatment with [place].  Microcytic Anemia Jenny Porter has a diagnosis of microcytic anemia. She notes fatigue and is on iron supplementation in a multivitamin.   Vitamin D deficiency Jenny Porter has a diagnosis of vitamin D deficiency. She is currently taking vit D and her level is improved from previously. She admits fatigue.  ALLERGIES: No Known Allergies  MEDICATIONS: Current Outpatient Medications on File Prior to Visit  Medication Sig Dispense Refill  . multivitamin-iron-minerals-folic acid (CENTRUM) chewable tablet Chew 1 tablet by mouth daily.    . Vitamin D, Ergocalciferol, (DRISDOL) 50000 units CAPS capsule Take 1 capsule (50,000 Units total) by mouth every 7 (seven) days. 4 capsule 0   No current facility-administered medications on file prior to visit.     PAST MEDICAL HISTORY: Past Medical History:  Diagnosis Date  . Anemia   . Asthma    rare inhaler use  . Heartburn in pregnancy   . No pertinent past medical history   . Posterior tibial tendinitis     PAST SURGICAL HISTORY: Past Surgical History:  Procedure Laterality Date  . CESAREAN SECTION    . CESAREAN SECTION  06/15/2011   Procedure: CESAREAN SECTION;  Surgeon: Zelphia Cairo, MD;  Location: [facility];  Service: Gynecology;  Laterality: N/A;  REPEAT EDC 5/25  . CESAREAN SECTION WITH BILATERAL TUBAL LIGATION N/A 08/05/2013   Procedure: REPEAT CESAREAN SECTION WITH  BILATERAL TUBAL LIGATION;  Surgeon: Zelphia Cairo, MD;  Location: [facility];  Service: Obstetrics;  Laterality: N/A;  . DILATION AND CURETTAGE OF UTERUS      SOCIAL HISTORY: Social History   Tobacco Use  . Smoking status: Never Smoker  . Smokeless tobacco: Never Used  Substance Use Topics  . Alcohol use: No  . Drug use: No    FAMILY HISTORY: Family History  Problem Relation Age of Onset  . Heart disease Paternal Grandmother   . Obesity Mother   . Obesity Father   . Anesthesia problems Neg Hx   . Hypotension Neg Hx   . Pseudochol deficiency Neg Hx   . Malignant hyperthermia Neg Hx     ROS: Review of Systems  Constitutional: Positive for malaise/fatigue. Negative for weight loss.    PHYSICAL EXAM: Blood pressure 103/69, pulse 60, temperature 97.9 F (36.6 C), temperature source Oral, height 5\' 2"  (1.575 m), weight 189 lb (85.7 kg), SpO2 99 %, currently breastfeeding. Body mass index is 34.57 kg/m. Physical Exam  Constitutional: She is oriented to person, place, and time. She appears well-developed and well-nourished.  Cardiovascular: Normal rate.  Pulmonary/Chest: Effort normal.  Musculoskeletal: Normal range of motion.  Neurological: She is oriented to person, place, and time.  Skin: Skin is warm and dry.  Psychiatric: She has a normal mood and affect. Her behavior is normal.  Vitals reviewed.   RECENT LABS AND TESTS: BMET    Component Value Date/Time  NA 137 06/30/2017 1342   K 4.4 06/30/2017 1342   CL 102 06/30/2017 1342   CO2 24 06/30/2017 1342   GLUCOSE 84 06/30/2017 1342   GLUCOSE 100 (H) 11/10/2006 1604   BUN 10 06/30/2017 1342   CREATININE 0.66 06/30/2017 1342   CALCIUM 9.3 06/30/2017 1342   GFRNONAA 112 06/30/2017 1342   GFRAA 129 06/30/2017 1342   Lab Results  Component Value Date   HGBA1C 5.3 06/30/2017   Lab Results  Component Value Date   INSULIN 8.5 06/30/2017   CBC    Component Value Date/Time   WBC 6.2 11/17/2017 0920   WBC 9.5  08/06/2013 0615   RBC 4.73 11/17/2017 0920   RBC 3.00 (L) 08/06/2013 0615   HGB 12.1 11/17/2017 0920   HCT 37.1 11/17/2017 0920   PLT 269 08/06/2013 0615   MCV 78 (L) 11/17/2017 0920   MCH 25.6 (L) 11/17/2017 0920   MCH 23.7 (L) 08/06/2013 0615   MCHC 32.6 11/17/2017 0920   MCHC 30.6 08/06/2013 0615   RDW 15.6 (H) 11/17/2017 0920   LYMPHSABS 2.1 11/17/2017 0920   MONOABS 0.7 11/10/2006 1643   EOSABS 0.2 11/17/2017 0920   BASOSABS 0.1 11/17/2017 0920   Iron/TIBC/Ferritin/ %Sat No results found for: IRON, TIBC, FERRITIN, IRONPCTSAT Lipid Panel     Component Value Date/Time   CHOL 161 06/30/2017 1342   TRIG 76 06/30/2017 1342   HDL 56 06/30/2017 1342   LDLCALC 90 06/30/2017 1342   Hepatic Function Panel     Component Value Date/Time   PROT 7.0 06/30/2017 1342   ALBUMIN 4.2 06/30/2017 1342   AST 13 06/30/2017 1342   ALT 10 06/30/2017 1342   ALKPHOS 85 06/30/2017 1342   BILITOT 0.3 06/30/2017 1342      Component Value Date/Time   TSH 0.902 06/30/2017 1342   TSH 1.268 11/10/2006 1604   Results for ZEVA, LEBER (MRN [id number]) as of 12/06/2017 10:03  Ref. Range 11/17/2017 09:20  Vitamin D, 25-Hydroxy Latest Ref Range: 30.0 - 100.0 ng/mL 37.7   ASSESSMENT AND PLAN: Microcytic anemia  Vitamin D deficiency  Class 1 obesity with serious comorbidity and body mass index (BMI) of 34.0 to 34.9 in adult, unspecified obesity type  PLAN:  Microcytic Anemia Jenny Porter has agreed to continue the multivitamin and we will repeat labs in 3 months. We will follow up for an office visit in 2 weeks.  Vitamin D Deficiency Jenny Porter was informed that low vitamin D levels contributes to fatigue and are associated with obesity, breast, and colon cancer. She agrees to switch to take OTC Vit D @5 ,000 IU every day and will follow up for routine testing of vitamin D, at least 2-3 times per year. She was informed of the risk of over-replacement of vitamin D and agrees to not increase her  dose unless she discusses this with [place] first. She agrees to follow up as directed.  I spent > than 50% of the 15 minute visit on counseling as documented in the note.  Obesity Jenny Porter is currently in the action stage of change. As such, her goal is to continue with weight loss efforts. She has agreed to follow the Keto diet. Jenny Porter has been instructed to work up to a goal of 150 minutes of combined cardio and strengthening exercise per week for weight loss and overall health benefits. We discussed the following Behavioral Modification Strategies today: increasing lean protein intake, increasing vegetables, work on meal planning and easy cooking plans, and planning  for success.  Jenny Porter has agreed to follow up with our clinic in 2 weeks. She was informed of the importance of frequent follow up visits to maximize her success with intensive lifestyle modifications for her multiple health conditions.   OBESITY BEHAVIORAL INTERVENTION VISIT  Today's visit was # 9   Starting weight: 198 lbs Starting date: 06/30/17 Today's weight : Weight: 189 lb (85.7 kg)  Today's date: 12/02/2017 Total lbs lost to date: 9  ASK: We discussed the diagnosis of obesity with Jenny Porter today and Jenny Porter agreed to give [place] permission to discuss obesity behavioral modification therapy today.  ASSESS: Jenny Porter has the diagnosis of obesity and her BMI today is 34.56. Jenny Porter is in the action stage of change.   ADVISE: Jenny Porter was educated on the multiple health risks of obesity as well as the benefit of weight loss to improve her health. She was advised of the need for long term treatment and the importance of lifestyle modifications to improve her current health and to decrease her risk of future health problems.  AGREE: Multiple dietary modification options and treatment options were discussed and Jenny Porter agreed to follow the recommendations documented in the above note.  ARRANGE: Jenny Porter was educated on  the importance of frequent visits to treat obesity as outlined per CMS and USPSTF guidelines and agreed to schedule her next follow up appointment today.  I, Kirke Corin, am acting as Energy manager for Filbert Schilder, MD  I have reviewed the above documentation for accuracy and completeness, and I agree with the above. - Debbra Riding, MD

## 2017-12-20 ENCOUNTER — Ambulatory Visit (INDEPENDENT_AMBULATORY_CARE_PROVIDER_SITE_OTHER): Payer: 59 | Admitting: Family Medicine

## 2017-12-20 VITALS — BP 113/79 | HR 66 | Temp 98.4°F | Ht 62.0 in | Wt 189.0 lb

## 2017-12-20 DIAGNOSIS — E669 Obesity, unspecified: Secondary | ICD-10-CM

## 2017-12-20 DIAGNOSIS — E559 Vitamin D deficiency, unspecified: Secondary | ICD-10-CM | POA: Diagnosis not present

## 2017-12-20 DIAGNOSIS — Z6834 Body mass index (BMI) 34.0-34.9, adult: Secondary | ICD-10-CM

## 2017-12-20 DIAGNOSIS — D509 Iron deficiency anemia, unspecified: Secondary | ICD-10-CM

## 2017-12-22 NOTE — Progress Notes (Signed)
Office: [phone]  /  Fax: [phone]   HPI:   Chief Complaint: OBESITY Jenny Porter is here to discuss her progress with her obesity treatment plan. She is on the Keto eating plan and is following her eating plan approximately 90 % of the time. She states she is doing [employer] 60 minutes 3 times per week. Lachele reports that she has been following the keto plan. She has family staying with her who is also following the keto plan. Her weight is 189 lb (85.7 kg) today and she has maintained weight over a period of 2 weeks since her last visit. She has lost 9 lbs since starting treatment with us.  Microcytic Anemia [name] has a diagnosis of anemia with a MCV of 78. She is not on iron supplementation.    Vitamin D deficiency Jenny Porter has a diagnosis of vitamin D deficiency. She is currently taking vit D and denies nausea, vomiting or muscle weakness.  ALLERGIES: No Known Allergies  MEDICATIONS: Current Outpatient Medications on File Prior to Visit  Medication Sig Dispense Refill  . multivitamin-iron-minerals-folic acid (CENTRUM) chewable tablet Chew 1 tablet by mouth daily.    . Vitamin D, Ergocalciferol, (DRISDOL) 50000 units CAPS capsule Take 1 capsule (50,000 Units total) by mouth every 7 (seven) days. 4 capsule 0   No current facility-administered medications on file prior to visit.     PAST MEDICAL HISTORY: Past Medical History:  Diagnosis Date  . Anemia   . Asthma    rare inhaler use  . Heartburn in pregnancy   . No pertinent past medical history   . Posterior tibial tendinitis     PAST SURGICAL HISTORY: Past Surgical History:  Procedure Laterality Date  . CESAREAN SECTION    . CESAREAN SECTION  06/15/2011   Procedure: CESAREAN SECTION;  Surgeon: Zelphia CairoGretchen Adkins, MD;  Location: [facility];  Service: Gynecology;  Laterality: N/A;  REPEAT EDC 5/25  . CESAREAN SECTION WITH BILATERAL TUBAL LIGATION N/A 08/05/2013   Procedure: REPEAT CESAREAN SECTION WITH BILATERAL TUBAL  LIGATION;  Surgeon: Zelphia CairoGretchen Adkins, MD;  Location: [facility];  Service: Obstetrics;  Laterality: N/A;  . DILATION AND CURETTAGE OF UTERUS      SOCIAL HISTORY: Social History   Tobacco Use  . Smoking status: Never Smoker  . Smokeless tobacco: Never Used  Substance Use Topics  . Alcohol use: No  . Drug use: No    FAMILY HISTORY: Family History  Problem Relation Age of Onset  . Heart disease Paternal Grandmother   . Obesity Mother   . Obesity Father   . Anesthesia problems Neg Hx   . Hypotension Neg Hx   . Pseudochol deficiency Neg Hx   . Malignant hyperthermia Neg Hx     ROS: Review of Systems  Constitutional: Positive for malaise/fatigue. Negative for weight loss.  Gastrointestinal: Negative for nausea and vomiting.  Musculoskeletal:       Negative for muscle weakness    PHYSICAL EXAM: Blood pressure 113/79, pulse 66, temperature 98.4 F (36.9 C), temperature source Oral, height 5\' 2"  (1.575 m), weight 189 lb (85.7 kg), SpO2 98 %, currently breastfeeding. Body mass index is 34.57 kg/m. Physical Exam  Constitutional: She is oriented to person, place, and time. She appears well-developed and well-nourished.  Cardiovascular: Normal rate.  Pulmonary/Chest: Effort normal.  Musculoskeletal: Normal range of motion.  Neurological: She is oriented to person, place, and time.  Skin: Skin is warm and dry.  Psychiatric: She has a normal mood and affect.  Her behavior is normal.  Vitals reviewed.   RECENT LABS AND TESTS: BMET    Component Value Date/Time   NA 137 06/30/2017 1342   K 4.4 06/30/2017 1342   CL 102 06/30/2017 1342   CO2 24 06/30/2017 1342   GLUCOSE 84 06/30/2017 1342   GLUCOSE 100 (H) 11/10/2006 1604   BUN 10 06/30/2017 1342   CREATININE 0.66 06/30/2017 1342   CALCIUM 9.3 06/30/2017 1342   GFRNONAA 112 06/30/2017 1342   GFRAA 129 06/30/2017 1342   Lab Results  Component Value Date   HGBA1C 5.3 06/30/2017   Lab Results  Component Value Date    INSULIN 8.5 06/30/2017   CBC    Component Value Date/Time   WBC 6.2 11/17/2017 0920   WBC 9.5 08/06/2013 0615   RBC 4.73 11/17/2017 0920   RBC 3.00 (L) 08/06/2013 0615   HGB 12.1 11/17/2017 0920   HCT 37.1 11/17/2017 0920   PLT 269 08/06/2013 0615   MCV 78 (L) 11/17/2017 0920   MCH 25.6 (L) 11/17/2017 0920   MCH 23.7 (L) 08/06/2013 0615   MCHC 32.6 11/17/2017 0920   MCHC 30.6 08/06/2013 0615   RDW 15.6 (H) 11/17/2017 0920   LYMPHSABS 2.1 11/17/2017 0920   MONOABS 0.7 11/10/2006 1643   EOSABS 0.2 11/17/2017 0920   BASOSABS 0.1 11/17/2017 0920   Iron/TIBC/Ferritin/ %Sat No results found for: IRON, TIBC, FERRITIN, IRONPCTSAT Lipid Panel     Component Value Date/Time   CHOL 161 06/30/2017 1342   TRIG 76 06/30/2017 1342   HDL 56 06/30/2017 1342   LDLCALC 90 06/30/2017 1342   Hepatic Function Panel     Component Value Date/Time   PROT 7.0 06/30/2017 1342   ALBUMIN 4.2 06/30/2017 1342   AST 13 06/30/2017 1342   ALT 10 06/30/2017 1342   ALKPHOS 85 06/30/2017 1342   BILITOT 0.3 06/30/2017 1342      Component Value Date/Time   TSH 0.902 06/30/2017 1342   TSH 1.268 11/10/2006 1604   Results for DEZIRE, TURK (MRN [id number]) as of 12/22/2017 16:20  Ref. Range 11/17/2017 09:20  Vitamin D, 25-Hydroxy Latest Ref Range: 30.0 - 100.0 ng/mL 37.7   ASSESSMENT AND PLAN: Microcytic anemia  Vitamin D deficiency  Class 1 obesity with serious comorbidity and body mass index (BMI) of 34.0 to 34.9 in adult, unspecified obesity type  PLAN:  Microcytic Anemia The diagnosis of microcytic anemia was discussed with [name] and was explained in detail. She was given suggestions of iron rich foods and iron supplement was not prescribed. We will follow up CBC in 3 months and [name] will follow up with our clinic as directed.  Vitamin D Deficiency [name] was informed that low vitamin D levels contributes to fatigue and are associated with obesity, breast, and colon cancer. She  agrees to continue to take Vit D @5 ,000 IU daily and will follow up for routine testing of vitamin D, at least 2-3 times per year. She was informed of the risk of over-replacement of vitamin D and agrees to not increase her dose unless she discusses this with [place] first. Amaira agrees to follow up as directed.  I spent > than 50% of the 15 minute visit on counseling as documented in the note.  Obesity Cayle is currently in the action stage of change. As such, her goal is to continue with weight loss efforts She has agreed to continue to follow the Keto plan Lorenia has been instructed to work up to a goal  of 150 minutes of combined cardio and strengthening exercise per week for weight loss and overall health benefits. We discussed the following Behavioral Modification Strategies today: planning for success, increasing lean protein intake, increasing vegetables and work on meal planning and easy cooking plans  Magnolia has agreed to follow up with our clinic as needed. She was informed of the importance of frequent follow up visits to maximize her success with intensive lifestyle modifications for her multiple health conditions.   OBESITY BEHAVIORAL INTERVENTION VISIT  Today's visit was # 10   Starting weight: 198 lbs Starting date: 06/30/2017 Today's weight : 189 lbs  Today's date: 12/20/2017 Total lbs lost to date: 9   ASK: We discussed the diagnosis of obesity with Chaya Jan today and Isabelly agreed to give [place] permission to discuss obesity behavioral modification therapy today.  ASSESS: Bridgid has the diagnosis of obesity and her BMI today is 34.56 Adilee is in the action stage of change   ADVISE: Srija was educated on the multiple health risks of obesity as well as the benefit of weight loss to improve her health. She was advised of the need for long term treatment and the importance of lifestyle modifications to improve her current health and to decrease her risk of  future health problems.  AGREE: Multiple dietary modification options and treatment options were discussed and  Lillia agreed to follow the recommendations documented in the above note.  ARRANGE: Brayleigh was educated on the importance of frequent visits to treat obesity as outlined per CMS and USPSTF guidelines and agreed to schedule her next follow up appointment today.  I, [place] Crane, am acting as transcriptionist for Filbert Schilder, MD  I have reviewed the above documentation for accuracy and completeness, and I agree with the above. - Debbra Riding, MD

## 2018-01-03 DIAGNOSIS — Z0100 Encounter for examination of eyes and vision without abnormal findings: Secondary | ICD-10-CM | POA: Diagnosis not present

## 2018-03-01 ENCOUNTER — Other Ambulatory Visit: Payer: Self-pay | Admitting: Adult Health

## 2018-03-01 DIAGNOSIS — M25571 Pain in right ankle and joints of right foot: Secondary | ICD-10-CM

## 2018-03-05 NOTE — Progress Notes (Signed)
[facility] [address] [place], [place] [address] Phone: [phone] Subjective:   Jenny Porter, am serving as a scribe for Dr. Hulan Saas. Seeing this patient by the request  of:  Cathlean Sauer, MD   CC: right ankle pain   UKG:[id number]  Jenny Porter is a 38 y.o. female coming in with complaint of right ankle pain. Pain is chronic lasting for greater than 8 years. Pain localized over medial malleolus. Pain constant but does become worse with physical active. Patient does [employer] Do. Does prednisone and that helped but pain does come back. Has tried cam walkers and ankle braces.  Patient would like to be active but is concerned that anytime she increases activity has worsening pain.  MRI of the right ankle from 2013 was independently visualized by me showing a posterior tibialis and flexor hallucis longus tenosynovitis without any specific tear.     Past Medical History:  Diagnosis Date  . Anemia   . Asthma    rare inhaler use  . Heartburn in pregnancy   . Porter pertinent past medical history   . Posterior tibial tendinitis    Past Surgical History:  Procedure Laterality Date  . CESAREAN SECTION    . CESAREAN SECTION  06/15/2011   Procedure: CESAREAN SECTION;  Surgeon: Marylynn Pearson, MD;  Location: [facility];  Service: Gynecology;  Laterality: N/A;  REPEAT EDC 5/25  . CESAREAN SECTION WITH BILATERAL TUBAL LIGATION N/A 08/05/2013   Procedure: REPEAT CESAREAN SECTION WITH BILATERAL TUBAL LIGATION;  Surgeon: Marylynn Pearson, MD;  Location: [facility];  Service: Obstetrics;  Laterality: N/A;  . DILATION AND CURETTAGE OF UTERUS     Social History   Socioeconomic History  . Marital status: Married    Spouse name: Barton Dubois  . Number of children: 3  . Years of education: Not on file  . Highest education level: Not on file  Occupational History  . Occupation: Physician  Social Needs  . Financial resource strain: Not on file  . Food  insecurity:    Worry: Not on file    Inability: Not on file  . Transportation needs:    Medical: Not on file    Non-medical: Not on file  Tobacco Use  . Smoking status: Never Smoker  . Smokeless tobacco: Never Used  Substance and Sexual Activity  . Alcohol use: Porter  . Drug use: Porter  . Sexual activity: Yes  Lifestyle  . Physical activity:    Days per week: Not on file    Minutes per session: Not on file  . Stress: Not on file  Relationships  . Social connections:    Talks on phone: Not on file    Gets together: Not on file    Attends religious service: Not on file    Active member of club or organization: Not on file    Attends meetings of clubs or organizations: Not on file    Relationship status: Not on file  Other Topics Concern  . Not on file  Social History Narrative  . Not on file   Porter Known Allergies Family History  Problem Relation Age of Onset  . Heart disease Paternal Grandmother   . Obesity Mother   . Obesity Father   . Anesthesia problems Neg Hx   . Hypotension Neg Hx   . Pseudochol deficiency Neg Hx   . Malignant hyperthermia Neg Hx      Current Outpatient Medications (Cardiovascular):  .  nitroGLYCERIN (NITRODUR - DOSED IN MG/24 HR) 0.2 mg/hr patch, 1/4 patch daily     Current Outpatient Medications (Other):  Marland Kitchen  Diclofenac Sodium 2 % SOLN, Apply 1 pump twice daily. .  multivitamin-iron-minerals-folic acid (CENTRUM) chewable tablet, Chew 1 tablet by mouth daily. .  Vitamin D, Ergocalciferol, (DRISDOL) 1.25 MG (50000 UT) CAPS capsule, Take 1 capsule (50,000 Units total) by mouth every 7 (seven) days.    Past medical history, social, surgical and family history all reviewed in electronic medical record.  Porter pertanent information unless stated regarding to the chief complaint.   Review of Systems:  Porter headache, visual changes, nausea, vomiting, diarrhea, constipation, dizziness, abdominal pain, skin rash, fevers, chills, night sweats, weight loss,  swollen lymph nodes, body aches, joint swelling, muscle aches, chest pain, shortness of breath, mood changes.   Objective  Blood pressure 98/74, pulse (!) 101, height 5' 2"  (1.575 m), weight 195 lb (88.5 kg), SpO2 98 %, currently breastfeeding.   General: Porter apparent distress alert and oriented x3 mood and affect normal, dressed appropriately.  HEENT: Pupils equal, extraocular movements intact  Respiratory: Patient's speak in full sentences and does not appear short of breath  Cardiovascular: Porter lower extremity edema, non tender, Porter erythema  Skin: Warm dry intact with Porter signs of infection or rash on extremities or on axial skeleton.  Abdomen: Soft nontender  Neuro: Cranial nerves II through XII are intact, neurovascularly intact in all extremities with 2+ DTRs and 2+ pulses.  Lymph: Porter lymphadenopathy of posterior or anterior cervical chain or axillae bilaterally.  Gait normal with good balance and coordination.  MSK:  Non tender with full range of motion and good stability and symmetric strength and tone of shoulders, elbows, wrist, hip, knee bilaterally.  Ankle: Right Swelling over the medial aspect of the ankle noted especially over the posterior tibialis tendon. Range of motion is full in all directions. Strength is 5/5 in all directions. Stable lateral and medial ligaments; squeeze test and kleiger test unremarkable; Pain over the posterior tibialis tendon Able to walk 4 steps. Severe pes planus noted  MSK US performed of:  This study was ordered, performed, and interpreted by Charlann Boxer D.O.  Foot/Ankle:   Posterior tibialis tendon does have an acute tear noted.  Increasing in Doppler flow in the surrounding area and hypoechoic changes noted as well.  Patient's inferior medial malleolus also has what appears to be an acute on chronic fracture noted.  Mild callus formation that is hard in the area but still significant amount of healing to go. Achilles tendon visualized along  length of tendon and unremarkable on long and transverse views without sheath effusion. Anterior Talofibular Ligament and Calcaneofibular Ligaments unremarkable and intact.   IMPRESSION:  .  Posterior tibialis tendon tear with medial malleolus avulsion fracture  [id number]; 15 additional minutes spent for Therapeutic exercises as stated in above notes.  This included exercises focusing on stretching, strengthening, with significant focus on eccentric aspects.   Long term goals include an improvement in range of motion, strength, endurance as well as avoiding reinjury. Patient's frequency would include in 1-2 times a day, 3-5 times a week for a duration of 6-12 weeks.Ankle strengthening that included:  Basic range of motion exercises to allow proper full motion at ankle Stretching of the lower leg and hamstrings  Theraband exercises for the lower leg - inversion, eversion, dorsiflexion and plantarflexion each to be completed with a theraband Balance exercises to increase proprioception Weight bearing exercises to increase  strength and balance   Proper technique shown and discussed handout in great detail with ATC.  All questions were discussed and answered.      Impression and Recommendations:     This case required medical decision making of moderate complexity. The above documentation has been reviewed and is accurate and complete Lyndal Pulley, DO       Note: This dictation was prepared with Dragon dictation along with smaller phrase technology. Any transcriptional errors that result from this process are unintentional.

## 2018-03-07 ENCOUNTER — Ambulatory Visit: Payer: Self-pay

## 2018-03-07 ENCOUNTER — Encounter: Payer: Self-pay | Admitting: Family Medicine

## 2018-03-07 ENCOUNTER — Ambulatory Visit: Payer: 59 | Admitting: Family Medicine

## 2018-03-07 VITALS — BP 98/74 | HR 101 | Ht 62.0 in | Wt 195.0 lb

## 2018-03-07 DIAGNOSIS — M25571 Pain in right ankle and joints of right foot: Principal | ICD-10-CM

## 2018-03-07 DIAGNOSIS — G8929 Other chronic pain: Secondary | ICD-10-CM | POA: Diagnosis not present

## 2018-03-07 DIAGNOSIS — M66871 Spontaneous rupture of other tendons, right ankle and foot: Secondary | ICD-10-CM | POA: Diagnosis not present

## 2018-03-07 DIAGNOSIS — E559 Vitamin D deficiency, unspecified: Secondary | ICD-10-CM

## 2018-03-07 DIAGNOSIS — M2141 Flat foot [pes planus] (acquired), right foot: Secondary | ICD-10-CM | POA: Diagnosis not present

## 2018-03-07 MED ORDER — DICLOFENAC SODIUM 2 % TD SOLN: TRANSDERMAL | 3 refills | Status: DC

## 2018-03-07 MED ORDER — NITROGLYCERIN 0.2 MG/HR TD PT24: MEDICATED_PATCH | TRANSDERMAL | 1 refills | Status: DC

## 2018-03-07 MED ORDER — VITAMIN D (ERGOCALCIFEROL) 1.25 MG (50000 UNIT) PO CAPS: 50000.0000 [IU] | ORAL_CAPSULE | ORAL | 0 refills | Status: DC

## 2018-03-07 NOTE — Assessment & Plan Note (Signed)
Likely overuse.  Also patient has pes planus.  Topical anti-inflammatories, nitroglycerin also given warned of potential side effects.  Once weekly vitamin D to help with muscle strength and endurance, discussed proper shoes, icing regimen, topical anti-inflammatories.  Patient will follow-up again in 4 weeks.  May need possible injection or even PRP.  Follow-up in 4 weeks

## 2018-03-07 NOTE — Assessment & Plan Note (Signed)
Discussed over-the-counter orthotics, heel lift, monitor closely.  Follow-up again in 4 weeks

## 2018-03-07 NOTE — Patient Instructions (Signed)
Good to see you  Ice 20 minutes 2 times daily. Usually after activity and before bed. pennsaid pinkie amount topically 2 times daily as needed.  Exercises 3 times a week.  Heel lift 1/16inch or 1/8 inch in shoe can help  Wear air cast daily for 1-2 weeks and then with exercise if possible for another 2-3 weeks Once weekly vitamin D for 12 weeks Nitroglycerin Protocol   Apply 1/4 nitroglycerin patch to affected area daily.  Change position of patch within the affected area every 24 hours.  You may experience a headache during the first 1-2 weeks of using the patch, these should subside.  If you experience headaches after beginning nitroglycerin patch treatment, you may take your preferred over the counter pain reliever.  Another side effect of the nitroglycerin patch is skin irritation or rash related to patch adhesive.  Please notify our office if you develop more severe headaches or rash, and stop the patch.  Tendon healing with nitroglycerin patch may require 12 to 24 weeks depending on the extent of injury.  Men should not use if taking Viagra, Cialis, or Levitra.   Do not use if you have migraines or rosacea.  See me again in 4-5 weeks and we will look at the tendon and bone again

## 2018-03-21 ENCOUNTER — Telehealth: Payer: Self-pay | Admitting: Family Medicine

## 2018-03-21 NOTE — Telephone Encounter (Signed)
Copied from CRM [id number]. Topic: Quick Communication - Rx Refill/Question >> Mar 21, 2018  2:32 PM Fanny Bien wrote: Medication: Pennsaid. Percilla [facility] and stated that she needs to know the diagnoses and the tried and failed medications. Please advise. Did not see where this medication was in chart. Please advise

## 2018-03-31 NOTE — Telephone Encounter (Signed)
Done

## 2018-04-04 DIAGNOSIS — Z0000 Encounter for general adult medical examination without abnormal findings: Secondary | ICD-10-CM | POA: Diagnosis not present

## 2018-04-04 DIAGNOSIS — Z1239 Encounter for other screening for malignant neoplasm of breast: Secondary | ICD-10-CM | POA: Diagnosis not present

## 2018-04-04 DIAGNOSIS — Z6835 Body mass index (BMI) 35.0-35.9, adult: Secondary | ICD-10-CM | POA: Diagnosis not present

## 2018-04-04 DIAGNOSIS — Z8639 Personal history of other endocrine, nutritional and metabolic disease: Secondary | ICD-10-CM | POA: Diagnosis not present

## 2018-04-09 NOTE — Progress Notes (Deleted)
[facility] [address] [place], [place] [address] Phone: [phone] Subjective:    I'm seeing this patient by the request  of:    CC: Ankle pain follow-up  KGY:[id number]  Jenny Porter is a 38 y.o. female coming in with complaint of ***  Onset-  Location Duration-  Character- Aggravating factors- Reliving factors-  Therapies tried-  Severity-     Past Medical History:  Diagnosis Date  . Anemia   . Asthma    rare inhaler use  . Heartburn in pregnancy   . No pertinent past medical history   . Posterior tibial tendinitis    Past Surgical History:  Procedure Laterality Date  . CESAREAN SECTION    . CESAREAN SECTION  06/15/2011   Procedure: CESAREAN SECTION;  Surgeon: Zelphia Cairo, MD;  Location: [facility];  Service: Gynecology;  Laterality: N/A;  REPEAT EDC 5/25  . CESAREAN SECTION WITH BILATERAL TUBAL LIGATION N/A 08/05/2013   Procedure: REPEAT CESAREAN SECTION WITH BILATERAL TUBAL LIGATION;  Surgeon: Zelphia Cairo, MD;  Location: [facility];  Service: Obstetrics;  Laterality: N/A;  . DILATION AND CURETTAGE OF UTERUS     Social History   Socioeconomic History  . Marital status: Married    Spouse name: Vassie Loll  . Number of children: 3  . Years of education: Not on file  . Highest education level: Not on file  Occupational History  . Occupation: Physician  Social Needs  . Financial resource strain: Not on file  . Food insecurity:    Worry: Not on file    Inability: Not on file  . Transportation needs:    Medical: Not on file    Non-medical: Not on file  Tobacco Use  . Smoking status: Never Smoker  . Smokeless tobacco: Never Used  Substance and Sexual Activity  . Alcohol use: No  . Drug use: No  . Sexual activity: Yes  Lifestyle  . Physical activity:    Days per week: Not on file    Minutes per session: Not on file  . Stress: Not on file  Relationships  . Social connections:    Talks on phone: Not on  file    Gets together: Not on file    Attends religious service: Not on file    Active member of club or organization: Not on file    Attends meetings of clubs or organizations: Not on file    Relationship status: Not on file  Other Topics Concern  . Not on file  Social History Narrative  . Not on file   No Known Allergies Family History  Problem Relation Age of Onset  . Heart disease Paternal Grandmother   . Obesity Mother   . Obesity Father   . Anesthesia problems Neg Hx   . Hypotension Neg Hx   . Pseudochol deficiency Neg Hx   . Malignant hyperthermia Neg Hx      Current Outpatient Medications (Cardiovascular):  .  nitroGLYCERIN (NITRODUR - DOSED IN MG/24 HR) 0.2 mg/hr patch, 1/4 patch daily     Current Outpatient Medications (Other):  Marland Kitchen  Diclofenac Sodium 2 % SOLN, Apply 1 pump twice daily. .  multivitamin-iron-minerals-folic acid (CENTRUM) chewable tablet, Chew 1 tablet by mouth daily. .  Vitamin D, Ergocalciferol, (DRISDOL) 1.25 MG (50000 UT) CAPS capsule, Take 1 capsule (50,000 Units total) by mouth every 7 (seven) days.    Past medical history, social, surgical and family history all reviewed in electronic medical  record.  No pertanent information unless stated regarding to the chief complaint.   Review of Systems:  No headache, visual changes, nausea, vomiting, diarrhea, constipation, dizziness, abdominal pain, skin rash, fevers, chills, night sweats, weight loss, swollen lymph nodes, body aches, joint swelling, muscle aches, chest pain, shortness of breath, mood changes.   Objective  currently breastfeeding. Systems examined below as of    General: No apparent distress alert and oriented x3 mood and affect normal, dressed appropriately.  HEENT: Pupils equal, extraocular movements intact  Respiratory: Patient's speak in full sentences and does not appear short of breath  Cardiovascular: No lower extremity edema, non tender, no erythema  Skin: Warm dry intact  with no signs of infection or rash on extremities or on axial skeleton.  Abdomen: Soft nontender  Neuro: Cranial nerves II through XII are intact, neurovascularly intact in all extremities with 2+ DTRs and 2+ pulses.  Lymph: No lymphadenopathy of posterior or anterior cervical chain or axillae bilaterally.  Gait normal with good balance and coordination.  MSK:  Non tender with full range of motion and good stability and symmetric strength and tone of shoulders, elbows, wrist, hip, knee bilaterally.  Ankle: No visible erythema or swelling. Range of motion is full in all directions. Strength is 5/5 in all directions. Stable lateral and medial ligaments; squeeze test and kleiger test unremarkable; Talar dome nontender; No pain at base of 5th MT; No tenderness over cuboid; No tenderness over N spot or navicular prominence No tenderness on posterior aspects of lateral and medial malleolus No sign of peroneal tendon subluxations or tenderness to palpation Negative tarsal tunnel tinel's Able to walk 4 steps.  MSK US performed of: *** This study was ordered, performed, and interpreted by Terrilee Files D.O.  Foot/Ankle:   All structures visualized.   Talar dome unremarkable  Ankle mortise without effusion. Peroneus longus and brevis tendons unremarkable on long and transverse views without sheath effusions. Posterior tibialis, flexor hallucis longus, and flexor digitorum longus tendons unremarkable on long and transverse views without sheath effusions. Achilles tendon visualized along length of tendon and unremarkable on long and transverse views without sheath effusion. Anterior Talofibular Ligament and Calcaneofibular Ligaments unremarkable and intact. Deltoid Ligament unremarkable and intact. Plantar fascia intact and without effusion, normal thickness. No increased doppler signal, cap sign, or thickening of tibial cortex. Power doppler signal normal.  IMPRESSION:  NORMAL ULTRASONOGRAPHIC  EXAMINATION OF THE FOOT/ANKLE.    Impression and Recommendations:     This case required medical decision making of moderate complexity. The above documentation has been reviewed and is accurate and complete Judi Saa, DO       Note: This dictation was prepared with Dragon dictation along with smaller phrase technology. Any transcriptional errors that result from this process are unintentional.

## 2018-04-11 ENCOUNTER — Ambulatory Visit: Payer: 59 | Admitting: Family Medicine

## 2018-08-06 ENCOUNTER — Other Ambulatory Visit: Payer: Self-pay | Admitting: Family Medicine

## 2018-08-08 ENCOUNTER — Other Ambulatory Visit: Payer: Self-pay

## 2018-08-08 MED ORDER — DICLOFENAC SODIUM 2 % TD SOLN: TRANSDERMAL | 3 refills | Status: DC

## 2018-10-17 DIAGNOSIS — Z01419 Encounter for gynecological examination (general) (routine) without abnormal findings: Secondary | ICD-10-CM | POA: Diagnosis not present

## 2018-10-17 DIAGNOSIS — Z1231 Encounter for screening mammogram for malignant neoplasm of breast: Secondary | ICD-10-CM | POA: Diagnosis not present

## 2018-10-17 DIAGNOSIS — Z6836 Body mass index (BMI) 36.0-36.9, adult: Secondary | ICD-10-CM | POA: Diagnosis not present

## 2018-10-19 ENCOUNTER — Other Ambulatory Visit: Payer: Self-pay | Admitting: Obstetrics and Gynecology

## 2018-10-19 DIAGNOSIS — R928 Other abnormal and inconclusive findings on diagnostic imaging of breast: Secondary | ICD-10-CM

## 2018-10-28 ENCOUNTER — Other Ambulatory Visit: Payer: Self-pay | Admitting: Occupational Medicine

## 2018-10-29 LAB — LIPID PANEL
Cholesterol: 190 mg/dL (ref <200)
HDL: 61 mg/dL (ref >=50)
LDL Cholesterol (Calc): 114 mg/dL (calc) — ABNORMAL HIGH
Non-HDL Cholesterol (Calc): 129 mg/dL (calc) (ref <130)
Total CHOL/HDL Ratio: 3.1 (calc) (ref <5.0)
Triglycerides: 64 mg/dL (ref <150)

## 2018-10-29 LAB — COMPLETE METABOLIC PANEL WITH GFR
AG Ratio: 1.7 (calc) (ref 1.0–2.5)
ALT: 11 U/L (ref 6–29)
AST: 15 U/L (ref 10–30)
Albumin: 4 g/dL (ref 3.6–5.1)
Alkaline phosphatase (APISO): 60 U/L (ref 31–125)
BUN: 11 mg/dL (ref 7–25)
CO2: 22 mmol/L (ref 20–32)
Calcium: 8.9 mg/dL (ref 8.6–10.2)
Chloride: 107 mmol/L (ref 98–110)
Creat: 0.68 mg/dL (ref 0.50–1.10)
GFR, Est African American: 127 mL/min/{1.73_m2} (ref >=60)
GFR, Est Non African American: 109 mL/min/{1.73_m2} (ref >=60)
Globulin: 2.4 g/dL (calc) (ref 1.9–3.7)
Glucose, Bld: 88 mg/dL (ref 65–99)
Potassium: 4.2 mmol/L (ref 3.5–5.3)
Sodium: 139 mmol/L (ref 135–146)
Total Bilirubin: 0.6 mg/dL (ref 0.2–1.2)
Total Protein: 6.4 g/dL (ref 6.1–8.1)

## 2018-10-29 LAB — TSH: TSH: 0.99 mIU/L

## 2018-10-29 LAB — CBC WITH DIFFERENTIAL/PLATELET
Absolute Monocytes: 445 cells/uL (ref 200–950)
Basophils Absolute: 63 cells/uL (ref 0–200)
Basophils Relative: 1.1 %
Eosinophils Absolute: 171 cells/uL (ref 15–500)
Eosinophils Relative: 3 %
HCT: 39.1 % (ref 35.0–45.0)
Hemoglobin: 12.7 g/dL (ref 11.7–15.5)
Lymphs Abs: 1972 cells/uL (ref 850–3900)
MCH: 28 pg (ref 27.0–33.0)
MCHC: 32.5 g/dL (ref 32.0–36.0)
MCV: 86.3 fL (ref 80.0–100.0)
MPV: 10.4 fL (ref 7.5–12.5)
Monocytes Relative: 7.8 %
Neutro Abs: 3050 cells/uL (ref 1500–7800)
Neutrophils Relative %: 53.5 %
Platelets: 313 10*3/uL (ref 140–400)
RBC: 4.53 10*6/uL (ref 3.80–5.10)
RDW: 12.5 % (ref 11.0–15.0)
Total Lymphocyte: 34.6 %
WBC: 5.7 10*3/uL (ref 3.8–10.8)

## 2018-10-29 LAB — VITAMIN D 25 HYDROXY (VIT D DEFICIENCY, FRACTURES): Vit D, 25-Hydroxy: 47 ng/mL (ref 30–100)

## 2018-10-31 ENCOUNTER — Ambulatory Visit
Admission: RE | Admit: 2018-10-31 | Discharge: 2018-10-31 | Disposition: A | Discharge status: Home / Self Care | Payer: 59 | Source: Ambulatory Visit | Attending: Obstetrics and Gynecology | Admitting: Obstetrics and Gynecology

## 2018-10-31 ENCOUNTER — Other Ambulatory Visit: Payer: Self-pay | Admitting: Obstetrics and Gynecology

## 2018-10-31 ENCOUNTER — Other Ambulatory Visit: Payer: Self-pay

## 2018-10-31 DIAGNOSIS — N6321 Unspecified lump in the left breast, upper outer quadrant: Secondary | ICD-10-CM | POA: Diagnosis not present

## 2018-10-31 DIAGNOSIS — R928 Other abnormal and inconclusive findings on diagnostic imaging of breast: Secondary | ICD-10-CM | POA: Diagnosis not present

## 2018-10-31 DIAGNOSIS — N63 Unspecified lump in unspecified breast: Secondary | ICD-10-CM

## 2019-02-18 DIAGNOSIS — Z0100 Encounter for examination of eyes and vision without abnormal findings: Secondary | ICD-10-CM | POA: Diagnosis not present

## 2019-02-26 ENCOUNTER — Other Ambulatory Visit: Payer: Self-pay | Admitting: Internal Medicine

## 2019-03-07 ENCOUNTER — Other Ambulatory Visit: Payer: Self-pay | Admitting: Adult Health

## 2019-03-07 ENCOUNTER — Other Ambulatory Visit: Payer: 59

## 2019-03-07 ENCOUNTER — Other Ambulatory Visit: Payer: Self-pay

## 2019-03-07 DIAGNOSIS — Z1152 Encounter for screening for COVID-19: Secondary | ICD-10-CM

## 2019-03-07 LAB — SARS-COV-2 IGG: SARS-COV-2 IgG: 42.89

## 2019-05-01 ENCOUNTER — Other Ambulatory Visit: Payer: Self-pay

## 2019-05-01 ENCOUNTER — Ambulatory Visit
Admission: RE | Admit: 2019-05-01 | Discharge: 2019-05-01 | Disposition: A | Discharge status: Home / Self Care | Payer: 59 | Source: Ambulatory Visit | Attending: Obstetrics and Gynecology | Admitting: Obstetrics and Gynecology

## 2019-05-01 ENCOUNTER — Other Ambulatory Visit: Payer: Self-pay | Admitting: Obstetrics and Gynecology

## 2019-05-01 DIAGNOSIS — N63 Unspecified lump in unspecified breast: Secondary | ICD-10-CM

## 2019-05-01 DIAGNOSIS — R928 Other abnormal and inconclusive findings on diagnostic imaging of breast: Secondary | ICD-10-CM | POA: Diagnosis not present

## 2019-05-01 DIAGNOSIS — N6321 Unspecified lump in the left breast, upper outer quadrant: Secondary | ICD-10-CM | POA: Diagnosis not present

## 2019-09-07 ENCOUNTER — Other Ambulatory Visit (INDEPENDENT_AMBULATORY_CARE_PROVIDER_SITE_OTHER): Payer: Self-pay | Admitting: Family Medicine

## 2019-09-07 DIAGNOSIS — E669 Obesity, unspecified: Secondary | ICD-10-CM

## 2019-09-07 DIAGNOSIS — Z6834 Body mass index (BMI) 34.0-34.9, adult: Secondary | ICD-10-CM

## 2019-09-07 MED ORDER — WEGOVY 0.25 MG/0.5ML ~~LOC~~ SOAJ: 0.2500 mg | SUBCUTANEOUS | 0 refills | Status: DC

## 2019-10-29 DIAGNOSIS — Z23 Encounter for immunization: Secondary | ICD-10-CM | POA: Diagnosis not present

## 2019-11-06 ENCOUNTER — Other Ambulatory Visit: Payer: 59

## 2019-11-13 ENCOUNTER — Other Ambulatory Visit: Payer: Self-pay

## 2019-11-13 ENCOUNTER — Other Ambulatory Visit: Payer: Self-pay | Admitting: Obstetrics and Gynecology

## 2019-11-13 ENCOUNTER — Ambulatory Visit
Admission: RE | Admit: 2019-11-13 | Discharge: 2019-11-13 | Disposition: A | Discharge status: Home / Self Care | Payer: 59 | Source: Ambulatory Visit | Attending: Obstetrics and Gynecology | Admitting: Obstetrics and Gynecology

## 2019-11-13 DIAGNOSIS — N63 Unspecified lump in unspecified breast: Secondary | ICD-10-CM

## 2019-11-13 DIAGNOSIS — N6489 Other specified disorders of breast: Secondary | ICD-10-CM | POA: Diagnosis not present

## 2019-11-13 DIAGNOSIS — N6321 Unspecified lump in the left breast, upper outer quadrant: Secondary | ICD-10-CM | POA: Diagnosis not present

## 2019-11-13 DIAGNOSIS — R928 Other abnormal and inconclusive findings on diagnostic imaging of breast: Secondary | ICD-10-CM | POA: Diagnosis not present

## 2019-11-27 ENCOUNTER — Ambulatory Visit
Admission: RE | Admit: 2019-11-27 | Discharge: 2019-11-27 | Disposition: A | Discharge status: Home / Self Care | Payer: 59 | Source: Ambulatory Visit | Attending: Obstetrics and Gynecology | Admitting: Obstetrics and Gynecology

## 2019-11-27 ENCOUNTER — Other Ambulatory Visit: Payer: Self-pay

## 2019-11-27 DIAGNOSIS — Z0389 Encounter for observation for other suspected diseases and conditions ruled out: Secondary | ICD-10-CM | POA: Diagnosis not present

## 2019-11-27 DIAGNOSIS — R928 Other abnormal and inconclusive findings on diagnostic imaging of breast: Secondary | ICD-10-CM | POA: Diagnosis not present

## 2019-11-27 DIAGNOSIS — N6489 Other specified disorders of breast: Secondary | ICD-10-CM

## 2019-12-04 ENCOUNTER — Other Ambulatory Visit: Payer: Self-pay

## 2019-12-04 ENCOUNTER — Encounter: Payer: Self-pay | Admitting: Family Medicine

## 2019-12-04 ENCOUNTER — Ambulatory Visit (INDEPENDENT_AMBULATORY_CARE_PROVIDER_SITE_OTHER): Payer: 59 | Admitting: Family Medicine

## 2019-12-04 DIAGNOSIS — G8929 Other chronic pain: Secondary | ICD-10-CM | POA: Diagnosis not present

## 2019-12-04 DIAGNOSIS — M25551 Pain in right hip: Secondary | ICD-10-CM | POA: Diagnosis not present

## 2019-12-04 MED ORDER — MELOXICAM 7.5 MG PO TABS: 7.5000 mg | ORAL_TABLET | Freq: Every day | ORAL | 0 refills | Status: DC

## 2019-12-04 NOTE — Patient Instructions (Signed)
Xray [facility] Meloxicam 7.5 daily for 10 days then as needed Ice after activity Try to externally rotate foot with squats when possible See me again in 5-6 weeks

## 2019-12-04 NOTE — Assessment & Plan Note (Signed)
Patient states been going on for greater than 6 months at this time.  Patient does have pain with internal range of motion.  X-rays are pending and patient will get it at a later date at another facility.  I am concerned for in the differential being more of a hip flexor tendinitis, femoral acetabular impingement or labral pathology.  Only hurting her with certain activities.  Discussed home exercises, icing regimen, meloxicam short course given.  Increase activity slowly and externally rotate leg on certain deeper squat follow-up again 5 to 6 weeks

## 2019-12-04 NOTE — Progress Notes (Addendum)
[facility] [address] [place] [address] Phone: [phone] Subjective:   Jenny Porter, am serving as a scribe for Dr. Antoine Porter. This visit occurred during the SARS-CoV-2 public health emergency.  Safety protocols were in place, including screening questions prior to the visit, additional usage of staff PPE, and extensive cleaning of exam room while observing appropriate contact time as indicated for disinfecting solutions.   I'm seeing this patient by the request  of:  Jenny Carson, MD  CC: Right hip pain   [name]  Jenny Porter is a 39 y.o. female coming in with complaint of right hip pain. Patient states that her pain began 6 months ago. Patient does martial arts 3x a week. Pain with extreme hip flexion and with trunk flexion. Patient denies any radiating symptoms, history of hip issues or back pain. Has not tried any treatments to help with pain.  Patient denies any association with any bowel or bladder.  Denies any fevers chills or any weight loss       Past Medical History:  Diagnosis Date  . Anemia   . Asthma    rare inhaler use  . Heartburn in pregnancy   . No pertinent past medical history   . Posterior tibial tendinitis    Past Surgical History:  Procedure Laterality Date  . CESAREAN SECTION    . CESAREAN SECTION  06/15/2011   Procedure: CESAREAN SECTION;  Surgeon: Jenny Cairo, MD;  Location: [facility];  Service: Gynecology;  Laterality: N/A;  REPEAT EDC 5/25  . CESAREAN SECTION WITH BILATERAL TUBAL LIGATION N/A 08/05/2013   Procedure: REPEAT CESAREAN SECTION WITH BILATERAL TUBAL LIGATION;  Surgeon: Jenny Cairo, MD;  Location: [facility];  Service: Obstetrics;  Laterality: N/A;  . DILATION AND CURETTAGE OF UTERUS     Social History   Socioeconomic History  . Marital status: Married    Spouse name: Jenny Porter  . Number of children: 3  . Years of education: Not on file  . Highest education  level: Not on file  Occupational History  . Occupation: Physician  Tobacco Use  . Smoking status: Never Smoker  . Smokeless tobacco: Never Used  Vaping Use  . Vaping Use: Never used  Substance and Sexual Activity  . Alcohol use: No  . Drug use: No  . Sexual activity: Yes  Other Topics Concern  . Not on file  Social History Narrative  . Not on file   Social Determinants of Health   Financial Resource Strain:   . Difficulty of Paying Living Expenses: Not on file  Food Insecurity:   . Worried About Programme researcher, broadcasting/film/video in the Last Year: Not on file  . Ran Out of Food in the Last Year: Not on file  Transportation Needs:   . Lack of Transportation (Medical): Not on file  . Lack of Transportation (Non-Medical): Not on file  Physical Activity:   . Days of Exercise per Week: Not on file  . Minutes of Exercise per Session: Not on file  Stress:   . Feeling of Stress : Not on file  Social Connections:   . Frequency of Communication with Friends and Family: Not on file  . Frequency of Social Gatherings with Friends and Family: Not on file  . Attends Religious Services: Not on file  . Active Member of Clubs or Organizations: Not on file  . Attends Banker Meetings: Not on file  . Marital Status: Not on  file   No Known Allergies Family History  Problem Relation Age of Onset  . Heart disease Paternal Grandmother   . Obesity Mother   . Obesity Father   . Anesthesia problems Neg Hx   . Hypotension Neg Hx   . Pseudochol deficiency Neg Hx   . Malignant hyperthermia Neg Hx      Current Outpatient Medications (Cardiovascular):  .  nitroGLYCERIN (NITRODUR - DOSED IN MG/24 HR) 0.2 mg/hr patch, 1/4 patch daily   Current Outpatient Medications (Analgesics):  .  meloxicam (MOBIC) 7.5 MG tablet, Take 1 tablet (7.5 mg total) by mouth daily.   Current Outpatient Medications (Other):  Marland Kitchen  Diclofenac Sodium 2 % SOLN, Apply 1 pump twice daily. .   multivitamin-iron-minerals-folic acid (CENTRUM) chewable tablet, Chew 1 tablet by mouth daily. .  Semaglutide-Weight Management (WEGOVY) 0.25 MG/0.5ML SOAJ, Inject 0.25 mg into the skin once a week. .  Vitamin D, Ergocalciferol, (DRISDOL) 1.25 MG (50000 UT) CAPS capsule, Take 1 capsule (50,000 Units total) by mouth every 7 (seven) days.   Reviewed prior external information including notes and imaging from  primary care provider As well as notes that were available from care everywhere and other healthcare systems.  Past medical history, social, surgical and family history all reviewed in electronic medical record.  No pertanent information unless stated regarding to the chief complaint.   Review of Systems:  No headache, visual changes, nausea, vomiting, diarrhea, constipation, dizziness, abdominal pain, skin rash, fevers, chills, night sweats, weight loss, swollen lymph nodes, body aches, joint swelling, chest pain, shortness of breath, mood changes. POSITIVE muscle aches  Objective  Blood pressure 112/76, pulse 72, height 5\' 2"  (1.575 m), weight 190 lb (86.2 kg), SpO2 97 %, currently breastfeeding.   General: No apparent distress alert and oriented x3 mood and affect normal, dressed appropriately.  HEENT: Pupils equal, extraocular movements intact  Respiratory: Patient's speak in full sentences and does not appear short of breath  Cardiovascular: No lower extremity edema, non tender, no erythema  Right hip exam shows the patient does have some mild pain with internal rotation.  Mild positive grind test.  Mild increase in discomfort with flexion of the hip.  Negative straight leg test.  No significant back pain.  5 out of 5 strength in lower extremity.    Impression and Recommendations:     The above documentation has been reviewed and is accurate and complete , DO

## 2019-12-12 ENCOUNTER — Ambulatory Visit (INDEPENDENT_AMBULATORY_CARE_PROVIDER_SITE_OTHER): Payer: 59

## 2019-12-12 DIAGNOSIS — G8929 Other chronic pain: Secondary | ICD-10-CM

## 2019-12-12 DIAGNOSIS — M25551 Pain in right hip: Secondary | ICD-10-CM | POA: Diagnosis not present

## 2020-01-05 NOTE — Progress Notes (Signed)
[name] [address] [place] [address] Phone: [phone] Subjective:   Bruce Donath, am serving as a scribe for Dr. Antoine Primas. This visit occurred during the SARS-CoV-2 public health emergency.  Safety protocols were in place, including screening questions prior to the visit, additional usage of staff PPE, and extensive cleaning of exam room while observing appropriate contact time as indicated for disinfecting solutions.   I'm seeing this patient by the request  of:  Herma Carson, MD  CC: Hip pain follow-up  EEF:[id number]   12/04/2019 Patient states been going on for greater than 6 months at this time.  Patient does have pain with internal range of motion.  X-rays are pending and patient will get it at a later date at another facility.  I am concerned for in the differential being more of a hip flexor tendinitis, femoral acetabular impingement or labral pathology.  Only hurting her with certain activities.  Discussed home exercises, icing regimen, meloxicam short course given.  Increase activity slowly and externally rotate leg on certain deeper squat follow-up again 5 to 6 weeks  Update 01/05/2020 Evelina Shatonia Hoots is a 39 y.o. female coming in with complaint of left hip pain. Patient states that she did take meloxicam which helped. Pain with hip IR. Pain remains achy with activity.  Patient states since nothing that is stopping her ago.  Overall can increase activity slowly.  Patient states its only when she internally rotates   Patient did have x-rays at last exam that were independently visualized by me showing no bony abnormality.    Past Medical History:  Diagnosis Date  . Anemia   . Asthma    rare inhaler use  . Heartburn in pregnancy   . No pertinent past medical history   . Posterior tibial tendinitis    Past Surgical History:  Procedure Laterality Date  . CESAREAN SECTION    . CESAREAN SECTION  06/15/2011    Procedure: CESAREAN SECTION;  Surgeon: Zelphia Cairo, MD;  Location: [facility];  Service: Gynecology;  Laterality: N/A;  REPEAT EDC 5/25  . CESAREAN SECTION WITH BILATERAL TUBAL LIGATION N/A 08/05/2013   Procedure: REPEAT CESAREAN SECTION WITH BILATERAL TUBAL LIGATION;  Surgeon: Zelphia Cairo, MD;  Location: [facility];  Service: Obstetrics;  Laterality: N/A;  . DILATION AND CURETTAGE OF UTERUS     Social History   Socioeconomic History  . Marital status: Married    Spouse name: Vassie Loll  . Number of children: 3  . Years of education: Not on file  . Highest education level: Not on file  Occupational History  . Occupation: Physician  Tobacco Use  . Smoking status: Never Smoker  . Smokeless tobacco: Never Used  Vaping Use  . Vaping Use: Never used  Substance and Sexual Activity  . Alcohol use: No  . Drug use: No  . Sexual activity: Yes  Other Topics Concern  . Not on file  Social History Narrative  . Not on file   Social Determinants of Health   Financial Resource Strain: Not on file  Food Insecurity: Not on file  Transportation Needs: Not on file  Physical Activity: Not on file  Stress: Not on file  Social Connections: Not on file   No Known Allergies Family History  Problem Relation Age of Onset  . Heart disease Paternal Grandmother   . Obesity Mother   . Obesity Father   . Anesthesia problems Neg Hx   . Hypotension  Neg Hx   . Pseudochol deficiency Neg Hx   . Malignant hyperthermia Neg Hx      Current Outpatient Medications (Cardiovascular):  .  nitroGLYCERIN (NITRODUR - DOSED IN MG/24 HR) 0.2 mg/hr patch, 1/4 patch daily   Current Outpatient Medications (Analgesics):  .  meloxicam (MOBIC) 7.5 MG tablet, Take 1 tablet (7.5 mg total) by mouth daily.   Current Outpatient Medications (Other):  Marland Kitchen  Vitamin D, Ergocalciferol, (DRISDOL) 1.25 MG (50000 UT) CAPS capsule, Take 1 capsule (50,000 Units total) by mouth every 7 (seven) days. .  Diclofenac Sodium 2 %  SOLN, Apply 1 pump twice daily. .  multivitamin-iron-minerals-folic acid (CENTRUM) chewable tablet, Chew 1 tablet by mouth daily. .  Semaglutide-Weight Management (WEGOVY) 0.25 MG/0.5ML SOAJ, Inject 0.25 mg into the skin once a week.   Reviewed prior external information including notes and imaging from  primary care provider As well as notes that were available from care everywhere and other healthcare systems.  Past medical history, social, surgical and family history all reviewed in electronic medical record.  No pertanent information unless stated regarding to the chief complaint.   Review of Systems:  No headache, visual changes, nausea, vomiting, diarrhea, constipation, dizziness, abdominal pain, skin rash, fevers, chills, night sweats, weight loss, swollen lymph nodes, body aches, joint swelling, chest pain, shortness of breath, mood changes. POSITIVE muscle aches  Objective  Blood pressure 104/78, pulse 63, height 5\' 2"  (1.575 m), SpO2 99 %, currently breastfeeding.   General: No apparent distress alert and oriented x3 mood and affect normal, dressed appropriately.  HEENT: Pupils equal, extraocular movements intact  Respiratory: Patient's speak in full sentences and does not appear short of breath  Cardiovascular: No lower extremity edema, non tender, no erythema  Gait normal with good balance and coordination.  MSK: Right hip exam shows the patient does have some mild impingement noted.  Still with internal rotation.  Negative straight leg test.  Mild pain in the groin area.    Impression and Recommendations:     The above documentation has been reviewed and is accurate and complete , DO

## 2020-01-08 ENCOUNTER — Ambulatory Visit (INDEPENDENT_AMBULATORY_CARE_PROVIDER_SITE_OTHER): Payer: 59 | Admitting: Family Medicine

## 2020-01-08 ENCOUNTER — Other Ambulatory Visit: Payer: Self-pay

## 2020-01-08 ENCOUNTER — Encounter: Payer: Self-pay | Admitting: Family Medicine

## 2020-01-08 DIAGNOSIS — M25551 Pain in right hip: Secondary | ICD-10-CM

## 2020-01-08 DIAGNOSIS — G8929 Other chronic pain: Secondary | ICD-10-CM | POA: Diagnosis not present

## 2020-01-08 NOTE — Patient Instructions (Signed)
Keep doing hip flexor stretches as needed You are awesome Happy Holidays See me when you need me

## 2020-01-08 NOTE — Assessment & Plan Note (Signed)
Patient is making some improvement.  Still pain with internal rotation.  Patient's x-rays were unremarkable some differential now is more of a hip flexor tendinitis versus possible labral pathology.  We discussed with patient in great length.  Discussed icing regimen and home exercises.  Patient can follow-up with me as needed

## 2020-02-19 DIAGNOSIS — Z0100 Encounter for examination of eyes and vision without abnormal findings: Secondary | ICD-10-CM | POA: Diagnosis not present

## 2020-05-02 ENCOUNTER — Other Ambulatory Visit: Payer: Self-pay | Admitting: Obstetrics and Gynecology

## 2020-05-02 DIAGNOSIS — Z09 Encounter for follow-up examination after completed treatment for conditions other than malignant neoplasm: Secondary | ICD-10-CM

## 2020-05-06 DIAGNOSIS — Z0000 Encounter for general adult medical examination without abnormal findings: Secondary | ICD-10-CM | POA: Diagnosis not present

## 2020-05-06 DIAGNOSIS — Z1329 Encounter for screening for other suspected endocrine disorder: Secondary | ICD-10-CM | POA: Diagnosis not present

## 2020-05-06 DIAGNOSIS — Z1321 Encounter for screening for nutritional disorder: Secondary | ICD-10-CM | POA: Diagnosis not present

## 2020-05-06 DIAGNOSIS — Z13220 Encounter for screening for lipoid disorders: Secondary | ICD-10-CM | POA: Diagnosis not present

## 2020-05-06 DIAGNOSIS — M25551 Pain in right hip: Secondary | ICD-10-CM | POA: Diagnosis not present

## 2020-05-06 DIAGNOSIS — Z8639 Personal history of other endocrine, nutritional and metabolic disease: Secondary | ICD-10-CM | POA: Diagnosis not present

## 2020-06-03 DIAGNOSIS — Z1231 Encounter for screening mammogram for malignant neoplasm of breast: Secondary | ICD-10-CM

## 2020-06-05 ENCOUNTER — Other Ambulatory Visit: Payer: Self-pay | Admitting: Obstetrics and Gynecology

## 2020-06-05 DIAGNOSIS — N63 Unspecified lump in unspecified breast: Secondary | ICD-10-CM

## 2020-06-10 ENCOUNTER — Ambulatory Visit
Admission: RE | Admit: 2020-06-10 | Discharge: 2020-06-10 | Disposition: A | Discharge status: Home / Self Care | Payer: 59 | Source: Ambulatory Visit | Attending: Obstetrics and Gynecology | Admitting: Obstetrics and Gynecology

## 2020-06-10 ENCOUNTER — Ambulatory Visit: Payer: 59

## 2020-06-10 ENCOUNTER — Other Ambulatory Visit: Payer: Self-pay

## 2020-06-10 ENCOUNTER — Other Ambulatory Visit: Payer: Self-pay | Admitting: Obstetrics and Gynecology

## 2020-06-10 DIAGNOSIS — N63 Unspecified lump in unspecified breast: Secondary | ICD-10-CM

## 2020-06-10 DIAGNOSIS — N6320 Unspecified lump in the left breast, unspecified quadrant: Secondary | ICD-10-CM

## 2020-06-10 DIAGNOSIS — N6489 Other specified disorders of breast: Secondary | ICD-10-CM

## 2020-06-10 DIAGNOSIS — R928 Other abnormal and inconclusive findings on diagnostic imaging of breast: Secondary | ICD-10-CM | POA: Diagnosis not present

## 2020-10-18 ENCOUNTER — Other Ambulatory Visit (INDEPENDENT_AMBULATORY_CARE_PROVIDER_SITE_OTHER): Payer: Self-pay | Admitting: Family Medicine

## 2020-10-18 ENCOUNTER — Other Ambulatory Visit (HOSPITAL_COMMUNITY): Payer: Self-pay

## 2020-10-18 DIAGNOSIS — E8881 Metabolic syndrome: Secondary | ICD-10-CM

## 2020-10-18 MED ORDER — TIRZEPATIDE 5 MG/0.5ML ~~LOC~~ SOAJ
5.0000 mg | SUBCUTANEOUS | 0 refills | Status: DC
  Filled 2020-10-18 – 2020-11-05 (×2): qty 2, 28d supply, fill #0

## 2020-10-21 ENCOUNTER — Other Ambulatory Visit (HOSPITAL_COMMUNITY): Payer: Self-pay

## 2020-10-23 ENCOUNTER — Other Ambulatory Visit (HOSPITAL_COMMUNITY): Payer: Self-pay

## 2020-10-29 ENCOUNTER — Other Ambulatory Visit (HOSPITAL_COMMUNITY): Payer: Self-pay

## 2020-11-05 ENCOUNTER — Other Ambulatory Visit (HOSPITAL_COMMUNITY): Payer: Self-pay

## 2020-12-04 ENCOUNTER — Other Ambulatory Visit (HOSPITAL_COMMUNITY): Payer: Self-pay

## 2020-12-04 ENCOUNTER — Other Ambulatory Visit (INDEPENDENT_AMBULATORY_CARE_PROVIDER_SITE_OTHER): Payer: Self-pay | Admitting: Family Medicine

## 2020-12-04 DIAGNOSIS — E8881 Metabolic syndrome: Secondary | ICD-10-CM

## 2020-12-04 MED ORDER — TIRZEPATIDE 7.5 MG/0.5ML ~~LOC~~ SOAJ
7.5000 mg | SUBCUTANEOUS | 0 refills | Status: DC
  Filled 2020-12-04: qty 6, 84d supply, fill #0

## 2020-12-05 ENCOUNTER — Other Ambulatory Visit (HOSPITAL_COMMUNITY): Payer: Self-pay

## 2020-12-06 ENCOUNTER — Other Ambulatory Visit (HOSPITAL_COMMUNITY): Payer: Self-pay

## 2020-12-09 ENCOUNTER — Ambulatory Visit
Admission: RE | Admit: 2020-12-09 | Discharge: 2020-12-09 | Disposition: A | Discharge status: Home / Self Care | Payer: 59 | Source: Ambulatory Visit | Attending: Obstetrics and Gynecology | Admitting: Obstetrics and Gynecology

## 2020-12-09 DIAGNOSIS — R928 Other abnormal and inconclusive findings on diagnostic imaging of breast: Secondary | ICD-10-CM | POA: Diagnosis not present

## 2020-12-09 DIAGNOSIS — N6321 Unspecified lump in the left breast, upper outer quadrant: Secondary | ICD-10-CM | POA: Diagnosis not present

## 2020-12-09 DIAGNOSIS — N6320 Unspecified lump in the left breast, unspecified quadrant: Secondary | ICD-10-CM

## 2020-12-12 ENCOUNTER — Other Ambulatory Visit: Payer: 59

## 2021-02-11 ENCOUNTER — Other Ambulatory Visit: Payer: Self-pay | Admitting: Family Medicine

## 2021-02-11 MED ORDER — MELOXICAM 7.5 MG PO TABS: 7.5000 mg | ORAL_TABLET | Freq: Every day | ORAL | 0 refills | Status: DC

## 2021-02-11 NOTE — Progress Notes (Signed)
Worsening pain in the hip flexor.  Encourage patient to do another 10-day burst.  If continued to have pain will have seen the patient at that time.

## 2021-02-12 ENCOUNTER — Other Ambulatory Visit (INDEPENDENT_AMBULATORY_CARE_PROVIDER_SITE_OTHER): Payer: Self-pay | Admitting: Family Medicine

## 2021-02-12 ENCOUNTER — Other Ambulatory Visit (HOSPITAL_COMMUNITY): Payer: Self-pay

## 2021-02-12 DIAGNOSIS — E8881 Metabolic syndrome: Secondary | ICD-10-CM

## 2021-02-12 MED ORDER — TIRZEPATIDE 7.5 MG/0.5ML ~~LOC~~ SOAJ
7.5000 mg | SUBCUTANEOUS | 0 refills | Status: DC
  Filled 2021-02-12: qty 6, 84d supply, fill #0

## 2021-03-21 DIAGNOSIS — Z0100 Encounter for examination of eyes and vision without abnormal findings: Secondary | ICD-10-CM | POA: Diagnosis not present

## 2021-03-28 DIAGNOSIS — H1045 Other chronic allergic conjunctivitis: Secondary | ICD-10-CM | POA: Diagnosis not present

## 2021-03-28 DIAGNOSIS — T783XXD Angioneurotic edema, subsequent encounter: Secondary | ICD-10-CM | POA: Diagnosis not present

## 2021-03-28 DIAGNOSIS — J3089 Other allergic rhinitis: Secondary | ICD-10-CM | POA: Diagnosis not present

## 2021-03-28 DIAGNOSIS — R059 Cough, unspecified: Secondary | ICD-10-CM | POA: Diagnosis not present

## 2021-04-10 ENCOUNTER — Other Ambulatory Visit (INDEPENDENT_AMBULATORY_CARE_PROVIDER_SITE_OTHER): Payer: Self-pay | Admitting: Family Medicine

## 2021-04-10 ENCOUNTER — Other Ambulatory Visit (HOSPITAL_COMMUNITY): Payer: Self-pay

## 2021-04-10 MED ORDER — TIRZEPATIDE 7.5 MG/0.5ML ~~LOC~~ SOAJ
7.5000 mg | SUBCUTANEOUS | 0 refills | Status: DC
  Filled 2021-04-10 – 2021-04-18 (×2): qty 6, 84d supply, fill #0

## 2021-04-12 DIAGNOSIS — J301 Allergic rhinitis due to pollen: Secondary | ICD-10-CM | POA: Diagnosis not present

## 2021-04-14 DIAGNOSIS — J3089 Other allergic rhinitis: Secondary | ICD-10-CM | POA: Diagnosis not present

## 2021-04-14 DIAGNOSIS — J3081 Allergic rhinitis due to animal (cat) (dog) hair and dander: Secondary | ICD-10-CM | POA: Diagnosis not present

## 2021-04-17 ENCOUNTER — Other Ambulatory Visit (HOSPITAL_COMMUNITY): Payer: Self-pay

## 2021-04-17 ENCOUNTER — Other Ambulatory Visit (INDEPENDENT_AMBULATORY_CARE_PROVIDER_SITE_OTHER): Payer: Self-pay | Admitting: Family Medicine

## 2021-04-17 DIAGNOSIS — J301 Allergic rhinitis due to pollen: Secondary | ICD-10-CM

## 2021-04-17 MED ORDER — AZELASTINE HCL 0.15 % NA SOLN
2.0000 | Freq: Every day | NASAL | 1 refills | Status: AC
  Filled 2021-04-17: qty 30, 50d supply, fill #0
  Filled 2021-07-22: qty 30, 150d supply, fill #0

## 2021-04-18 ENCOUNTER — Other Ambulatory Visit (HOSPITAL_COMMUNITY): Payer: Self-pay

## 2021-04-21 ENCOUNTER — Other Ambulatory Visit (HOSPITAL_COMMUNITY): Payer: Self-pay

## 2021-04-22 ENCOUNTER — Other Ambulatory Visit (HOSPITAL_COMMUNITY): Payer: Self-pay

## 2021-04-22 DIAGNOSIS — J301 Allergic rhinitis due to pollen: Secondary | ICD-10-CM | POA: Diagnosis not present

## 2021-04-22 DIAGNOSIS — J3081 Allergic rhinitis due to animal (cat) (dog) hair and dander: Secondary | ICD-10-CM | POA: Diagnosis not present

## 2021-04-22 DIAGNOSIS — J3089 Other allergic rhinitis: Secondary | ICD-10-CM | POA: Diagnosis not present

## 2021-04-24 ENCOUNTER — Other Ambulatory Visit (HOSPITAL_COMMUNITY): Payer: Self-pay

## 2021-04-25 DIAGNOSIS — J3089 Other allergic rhinitis: Secondary | ICD-10-CM | POA: Diagnosis not present

## 2021-04-25 DIAGNOSIS — J3081 Allergic rhinitis due to animal (cat) (dog) hair and dander: Secondary | ICD-10-CM | POA: Diagnosis not present

## 2021-04-25 DIAGNOSIS — Z01419 Encounter for gynecological examination (general) (routine) without abnormal findings: Secondary | ICD-10-CM | POA: Diagnosis not present

## 2021-04-25 DIAGNOSIS — Z1151 Encounter for screening for human papillomavirus (HPV): Secondary | ICD-10-CM | POA: Diagnosis not present

## 2021-04-25 DIAGNOSIS — Z6830 Body mass index (BMI) 30.0-30.9, adult: Secondary | ICD-10-CM | POA: Diagnosis not present

## 2021-04-25 DIAGNOSIS — Z124 Encounter for screening for malignant neoplasm of cervix: Secondary | ICD-10-CM | POA: Diagnosis not present

## 2021-04-25 DIAGNOSIS — J301 Allergic rhinitis due to pollen: Secondary | ICD-10-CM | POA: Diagnosis not present

## 2021-04-29 DIAGNOSIS — J301 Allergic rhinitis due to pollen: Secondary | ICD-10-CM | POA: Diagnosis not present

## 2021-04-29 DIAGNOSIS — J3089 Other allergic rhinitis: Secondary | ICD-10-CM | POA: Diagnosis not present

## 2021-04-29 DIAGNOSIS — J3081 Allergic rhinitis due to animal (cat) (dog) hair and dander: Secondary | ICD-10-CM | POA: Diagnosis not present

## 2021-05-01 ENCOUNTER — Other Ambulatory Visit (HOSPITAL_COMMUNITY): Payer: Self-pay

## 2021-05-01 DIAGNOSIS — J3089 Other allergic rhinitis: Secondary | ICD-10-CM | POA: Diagnosis not present

## 2021-05-01 DIAGNOSIS — J3081 Allergic rhinitis due to animal (cat) (dog) hair and dander: Secondary | ICD-10-CM | POA: Diagnosis not present

## 2021-05-01 DIAGNOSIS — J301 Allergic rhinitis due to pollen: Secondary | ICD-10-CM | POA: Diagnosis not present

## 2021-05-01 MED ORDER — LEVOCETIRIZINE DIHYDROCHLORIDE 5 MG PO TABS
ORAL_TABLET | ORAL | 3 refills | Status: DC
  Filled 2021-05-01 – 2021-05-28 (×3): qty 30, 30d supply, fill #0
  Filled 2021-06-25: qty 90, 90d supply, fill #1

## 2021-05-09 ENCOUNTER — Other Ambulatory Visit (HOSPITAL_COMMUNITY): Payer: Self-pay

## 2021-05-12 DIAGNOSIS — J301 Allergic rhinitis due to pollen: Secondary | ICD-10-CM | POA: Diagnosis not present

## 2021-05-12 DIAGNOSIS — J3081 Allergic rhinitis due to animal (cat) (dog) hair and dander: Secondary | ICD-10-CM | POA: Diagnosis not present

## 2021-05-12 DIAGNOSIS — J3089 Other allergic rhinitis: Secondary | ICD-10-CM | POA: Diagnosis not present

## 2021-05-13 ENCOUNTER — Other Ambulatory Visit (INDEPENDENT_AMBULATORY_CARE_PROVIDER_SITE_OTHER): Payer: Self-pay | Admitting: Family Medicine

## 2021-05-13 ENCOUNTER — Other Ambulatory Visit (HOSPITAL_COMMUNITY): Payer: Self-pay

## 2021-05-13 DIAGNOSIS — E669 Obesity, unspecified: Secondary | ICD-10-CM

## 2021-05-13 MED ORDER — WEGOVY 1.7 MG/0.75ML ~~LOC~~ SOAJ
1.7000 mg | SUBCUTANEOUS | 0 refills | Status: DC
  Filled 2021-05-13: qty 3, 28d supply, fill #0
  Filled 2021-05-23: qty 3, 30d supply, fill #0

## 2021-05-16 ENCOUNTER — Other Ambulatory Visit (HOSPITAL_COMMUNITY): Payer: Self-pay

## 2021-05-16 DIAGNOSIS — J3081 Allergic rhinitis due to animal (cat) (dog) hair and dander: Secondary | ICD-10-CM | POA: Diagnosis not present

## 2021-05-16 DIAGNOSIS — J3089 Other allergic rhinitis: Secondary | ICD-10-CM | POA: Diagnosis not present

## 2021-05-16 DIAGNOSIS — J301 Allergic rhinitis due to pollen: Secondary | ICD-10-CM | POA: Diagnosis not present

## 2021-05-19 DIAGNOSIS — J3081 Allergic rhinitis due to animal (cat) (dog) hair and dander: Secondary | ICD-10-CM | POA: Diagnosis not present

## 2021-05-19 DIAGNOSIS — J301 Allergic rhinitis due to pollen: Secondary | ICD-10-CM | POA: Diagnosis not present

## 2021-05-19 DIAGNOSIS — J3089 Other allergic rhinitis: Secondary | ICD-10-CM | POA: Diagnosis not present

## 2021-05-21 ENCOUNTER — Other Ambulatory Visit (HOSPITAL_COMMUNITY): Payer: Self-pay

## 2021-05-21 DIAGNOSIS — J3081 Allergic rhinitis due to animal (cat) (dog) hair and dander: Secondary | ICD-10-CM | POA: Diagnosis not present

## 2021-05-21 DIAGNOSIS — J3089 Other allergic rhinitis: Secondary | ICD-10-CM | POA: Diagnosis not present

## 2021-05-21 DIAGNOSIS — J301 Allergic rhinitis due to pollen: Secondary | ICD-10-CM | POA: Diagnosis not present

## 2021-05-23 ENCOUNTER — Other Ambulatory Visit (HOSPITAL_COMMUNITY): Payer: Self-pay

## 2021-05-23 DIAGNOSIS — J3081 Allergic rhinitis due to animal (cat) (dog) hair and dander: Secondary | ICD-10-CM | POA: Diagnosis not present

## 2021-05-23 DIAGNOSIS — J301 Allergic rhinitis due to pollen: Secondary | ICD-10-CM | POA: Diagnosis not present

## 2021-05-23 DIAGNOSIS — J3089 Other allergic rhinitis: Secondary | ICD-10-CM | POA: Diagnosis not present

## 2021-05-26 DIAGNOSIS — J301 Allergic rhinitis due to pollen: Secondary | ICD-10-CM | POA: Diagnosis not present

## 2021-05-26 DIAGNOSIS — J3081 Allergic rhinitis due to animal (cat) (dog) hair and dander: Secondary | ICD-10-CM | POA: Diagnosis not present

## 2021-05-26 DIAGNOSIS — J3089 Other allergic rhinitis: Secondary | ICD-10-CM | POA: Diagnosis not present

## 2021-05-27 ENCOUNTER — Telehealth (INDEPENDENT_AMBULATORY_CARE_PROVIDER_SITE_OTHER): Payer: 59 | Admitting: Family Medicine

## 2021-05-27 ENCOUNTER — Encounter (INDEPENDENT_AMBULATORY_CARE_PROVIDER_SITE_OTHER): Payer: Self-pay | Admitting: Family Medicine

## 2021-05-27 DIAGNOSIS — E669 Obesity, unspecified: Secondary | ICD-10-CM

## 2021-05-27 DIAGNOSIS — E668 Other obesity: Secondary | ICD-10-CM

## 2021-05-27 DIAGNOSIS — E8881 Metabolic syndrome: Secondary | ICD-10-CM | POA: Diagnosis not present

## 2021-05-27 DIAGNOSIS — E559 Vitamin D deficiency, unspecified: Secondary | ICD-10-CM | POA: Diagnosis not present

## 2021-05-27 DIAGNOSIS — Z6834 Body mass index (BMI) 34.0-34.9, adult: Secondary | ICD-10-CM

## 2021-05-28 ENCOUNTER — Encounter (INDEPENDENT_AMBULATORY_CARE_PROVIDER_SITE_OTHER): Payer: Self-pay

## 2021-05-28 ENCOUNTER — Telehealth (INDEPENDENT_AMBULATORY_CARE_PROVIDER_SITE_OTHER): Payer: Self-pay | Admitting: Family Medicine

## 2021-05-28 ENCOUNTER — Other Ambulatory Visit (HOSPITAL_COMMUNITY): Payer: Self-pay

## 2021-05-28 DIAGNOSIS — J3081 Allergic rhinitis due to animal (cat) (dog) hair and dander: Secondary | ICD-10-CM | POA: Diagnosis not present

## 2021-05-28 DIAGNOSIS — J3089 Other allergic rhinitis: Secondary | ICD-10-CM | POA: Diagnosis not present

## 2021-05-28 DIAGNOSIS — J301 Allergic rhinitis due to pollen: Secondary | ICD-10-CM | POA: Diagnosis not present

## 2021-05-28 NOTE — Telephone Encounter (Signed)
Dr. Jearld Shines - ok t do prior authorization for this patient per Dr. Jearld Shines. Prior authorization approved for [facility]. Effective: 05/28/2021 to 11/27/2021. Patient sent approval message via mychart.  ?

## 2021-05-30 DIAGNOSIS — J3081 Allergic rhinitis due to animal (cat) (dog) hair and dander: Secondary | ICD-10-CM | POA: Diagnosis not present

## 2021-05-30 DIAGNOSIS — J3089 Other allergic rhinitis: Secondary | ICD-10-CM | POA: Diagnosis not present

## 2021-05-30 DIAGNOSIS — J301 Allergic rhinitis due to pollen: Secondary | ICD-10-CM | POA: Diagnosis not present

## 2021-06-03 DIAGNOSIS — J301 Allergic rhinitis due to pollen: Secondary | ICD-10-CM | POA: Diagnosis not present

## 2021-06-03 DIAGNOSIS — J3089 Other allergic rhinitis: Secondary | ICD-10-CM | POA: Diagnosis not present

## 2021-06-03 DIAGNOSIS — J3081 Allergic rhinitis due to animal (cat) (dog) hair and dander: Secondary | ICD-10-CM | POA: Diagnosis not present

## 2021-06-06 DIAGNOSIS — J301 Allergic rhinitis due to pollen: Secondary | ICD-10-CM | POA: Diagnosis not present

## 2021-06-06 DIAGNOSIS — J3089 Other allergic rhinitis: Secondary | ICD-10-CM | POA: Diagnosis not present

## 2021-06-06 DIAGNOSIS — J3081 Allergic rhinitis due to animal (cat) (dog) hair and dander: Secondary | ICD-10-CM | POA: Diagnosis not present

## 2021-06-07 DIAGNOSIS — E8881 Metabolic syndrome: Secondary | ICD-10-CM | POA: Insufficient documentation

## 2021-06-07 NOTE — Progress Notes (Signed)
TeleHealth Visit:  Due to the COVID-19 pandemic, this visit was completed with telemedicine (audio/video) technology to reduce patient and provider exposure as well as to preserve personal protective equipment.   Jenny Porter has verbally consented to this TeleHealth visit. The patient is located at home, the provider is located at the [facility] office. The participants in this visit include the listed provider and patient. The visit was conducted today via MyChart video.  Chief Complaint: OBESITY Jenny Porter is here to discuss her progress with her obesity treatment plan along with follow-up of her obesity related diagnoses. Jenny Porter is on Keto, per last visit note.  [name] states she is doing 35 minutes of cardio daily with some resistance training.  Today's visit was #: 11 Starting weight: 198 lbs Starting date: 06/30/2017  Interim History: Jenny Porter is returning to clinic since 3 years ago.  Last Mounjaro injection last week.  Jenny Porter is noticing in the last few weeks, she could definitely experience more consistent hunger and drive to eat.  She has been doing 35 minutes cardio daily with some resistance training.  Weight trend has been 177 lbs in October 2022 to 165 lbs in April 2023.  Subjective:   1. Insulin resistance Jenny Porter has done exceptionally well on Mounjaro.  Her weight trend is 177.5, 175.3, 172.2, 170.0, 166.5, 164.9, 165.5.  2. Vitamin D deficiency Jenny Porter is taking OTC vitamin D. Last vitamin d level was 36 1 year ago.  Assessment/Plan:   1. Insulin resistance Jenny Porter has agreed to transition to GLP-1 due to insurance coverage.   2. Vitamin D deficiency Jenny Porter has been encouraged to  continue vitamin d, labs per PCP.  3. Class 1 obesity with serious comorbidity and body mass index (BMI) of 34.0 to 34.9 in adult, unspecified obesity type [name] has agreed to start [facility] 1.7 into the skin once a week SQ. Focusing on 10 cal; 1 gram of protein  daily.  Semaglutide-Weight Management (WEGOVY) 1.7 MG/0.75ML SOAJ  Sig: Inject 1.7 mg into the skin once a week. Dose: 1.7 mg Route: Subcutaneous Frequency: Weekly  Dispense Quantity: 3 mL Refills: 0 Fills remaining: 0        Jenny Porter is currently in the action stage of change. As such, her goal is to continue with weight loss efforts. She has agreed to plan, following a lower carbohydrate, vegetable and lean protein rich diet plan.  Exercise goals:  As is.  Behavioral modification strategies: increasing lean protein intake, meal planning and cooking strategies, keeping healthy foods in the home, and planning for success.  Jenny Porter has agreed to follow-up with our clinic in 6 months. She was informed of the importance of frequent follow-up visits to maximize her success with intensive lifestyle modifications for her multiple health conditions.  Objective:   VITALS: Per patient if applicable, see vitals. GENERAL: Alert and in no acute distress. CARDIOPULMONARY: No increased WOB. Speaking in clear sentences.  PSYCH: Pleasant and cooperative. Speech normal rate and rhythm. Affect is appropriate. Insight and judgement are appropriate. Attention is focused, linear, and appropriate.  NEURO: Oriented as arrived to appointment on time with no prompting.   Lab Results  Component Value Date   CREATININE 0.68 10/28/2018   BUN 11 10/28/2018   NA 139 10/28/2018   K 4.2 10/28/2018   CL 107 10/28/2018   CO2 22 10/28/2018   Lab Results  Component Value Date   ALT 11 10/28/2018   AST 15 10/28/2018   ALKPHOS 85 06/30/2017  BILITOT 0.6 10/28/2018   Lab Results  Component Value Date   HGBA1C 5.3 06/30/2017   Lab Results  Component Value Date   INSULIN 8.5 06/30/2017   Lab Results  Component Value Date   TSH 0.99 10/28/2018   Lab Results  Component Value Date   CHOL 190 10/28/2018   HDL 61 10/28/2018   LDLCALC 114 (H) 10/28/2018   TRIG 64 10/28/2018   CHOLHDL 3.1 10/28/2018   Lab  Results  Component Value Date   VD25OH 47 10/28/2018   VD25OH 37.7 11/17/2017   VD25OH 21.5 (L) 06/30/2017   Lab Results  Component Value Date   WBC 5.7 10/28/2018   HGB 12.7 10/28/2018   HCT 39.1 10/28/2018   MCV 86.3 10/28/2018   PLT 313 10/28/2018   No results found for: IRON, TIBC, FERRITIN  Attestation Statements:   Reviewed by clinician on day of visit: allergies, medications, problem list, medical history, surgical history, family history, social history, and previous encounter notes.  I, Larey Days, am acting as transcriptionist for Reuben Likes, MD.  I have reviewed the above documentation for accuracy and completeness, and I agree with the above. - Reuben Likes, MD

## 2021-06-10 DIAGNOSIS — J3081 Allergic rhinitis due to animal (cat) (dog) hair and dander: Secondary | ICD-10-CM | POA: Diagnosis not present

## 2021-06-10 DIAGNOSIS — J301 Allergic rhinitis due to pollen: Secondary | ICD-10-CM | POA: Diagnosis not present

## 2021-06-10 DIAGNOSIS — J3089 Other allergic rhinitis: Secondary | ICD-10-CM | POA: Diagnosis not present

## 2021-06-17 DIAGNOSIS — J3089 Other allergic rhinitis: Secondary | ICD-10-CM | POA: Diagnosis not present

## 2021-06-17 DIAGNOSIS — J301 Allergic rhinitis due to pollen: Secondary | ICD-10-CM | POA: Diagnosis not present

## 2021-06-17 DIAGNOSIS — J3081 Allergic rhinitis due to animal (cat) (dog) hair and dander: Secondary | ICD-10-CM | POA: Diagnosis not present

## 2021-06-19 ENCOUNTER — Other Ambulatory Visit (HOSPITAL_COMMUNITY): Payer: Self-pay

## 2021-06-19 ENCOUNTER — Other Ambulatory Visit (INDEPENDENT_AMBULATORY_CARE_PROVIDER_SITE_OTHER): Payer: Self-pay | Admitting: Family Medicine

## 2021-06-19 DIAGNOSIS — E669 Obesity, unspecified: Secondary | ICD-10-CM

## 2021-06-19 DIAGNOSIS — J3081 Allergic rhinitis due to animal (cat) (dog) hair and dander: Secondary | ICD-10-CM | POA: Diagnosis not present

## 2021-06-19 DIAGNOSIS — J3089 Other allergic rhinitis: Secondary | ICD-10-CM | POA: Diagnosis not present

## 2021-06-19 DIAGNOSIS — J301 Allergic rhinitis due to pollen: Secondary | ICD-10-CM | POA: Diagnosis not present

## 2021-06-19 MED ORDER — WEGOVY 2.4 MG/0.75ML ~~LOC~~ SOAJ
2.4000 mg | SUBCUTANEOUS | 0 refills | Status: DC
  Filled 2021-06-19: qty 3, 28d supply, fill #0

## 2021-06-25 ENCOUNTER — Other Ambulatory Visit (HOSPITAL_COMMUNITY): Payer: Self-pay

## 2021-06-26 DIAGNOSIS — J3089 Other allergic rhinitis: Secondary | ICD-10-CM | POA: Diagnosis not present

## 2021-06-26 DIAGNOSIS — J3081 Allergic rhinitis due to animal (cat) (dog) hair and dander: Secondary | ICD-10-CM | POA: Diagnosis not present

## 2021-06-26 DIAGNOSIS — J301 Allergic rhinitis due to pollen: Secondary | ICD-10-CM | POA: Diagnosis not present

## 2021-07-01 DIAGNOSIS — J3089 Other allergic rhinitis: Secondary | ICD-10-CM | POA: Diagnosis not present

## 2021-07-01 DIAGNOSIS — J301 Allergic rhinitis due to pollen: Secondary | ICD-10-CM | POA: Diagnosis not present

## 2021-07-01 DIAGNOSIS — J3081 Allergic rhinitis due to animal (cat) (dog) hair and dander: Secondary | ICD-10-CM | POA: Diagnosis not present

## 2021-07-04 DIAGNOSIS — J3081 Allergic rhinitis due to animal (cat) (dog) hair and dander: Secondary | ICD-10-CM | POA: Diagnosis not present

## 2021-07-04 DIAGNOSIS — J3089 Other allergic rhinitis: Secondary | ICD-10-CM | POA: Diagnosis not present

## 2021-07-04 DIAGNOSIS — J301 Allergic rhinitis due to pollen: Secondary | ICD-10-CM | POA: Diagnosis not present

## 2021-07-08 ENCOUNTER — Other Ambulatory Visit (INDEPENDENT_AMBULATORY_CARE_PROVIDER_SITE_OTHER): Payer: Self-pay | Admitting: Family Medicine

## 2021-07-08 ENCOUNTER — Other Ambulatory Visit (HOSPITAL_COMMUNITY): Payer: Self-pay

## 2021-07-08 DIAGNOSIS — J3081 Allergic rhinitis due to animal (cat) (dog) hair and dander: Secondary | ICD-10-CM | POA: Diagnosis not present

## 2021-07-08 DIAGNOSIS — J301 Allergic rhinitis due to pollen: Secondary | ICD-10-CM | POA: Diagnosis not present

## 2021-07-08 DIAGNOSIS — J3089 Other allergic rhinitis: Secondary | ICD-10-CM | POA: Diagnosis not present

## 2021-07-08 DIAGNOSIS — E669 Obesity, unspecified: Secondary | ICD-10-CM

## 2021-07-08 MED ORDER — WEGOVY 2.4 MG/0.75ML ~~LOC~~ SOAJ
2.4000 mg | SUBCUTANEOUS | 0 refills | Status: DC
  Filled 2021-07-08: qty 9, 84d supply, fill #0
  Filled 2021-07-22: qty 3, 28d supply, fill #0
  Filled 2021-08-13: qty 3, 28d supply, fill #1
  Filled 2021-09-10: qty 3, 28d supply, fill #2

## 2021-07-22 ENCOUNTER — Other Ambulatory Visit (HOSPITAL_COMMUNITY): Payer: Self-pay

## 2021-07-25 DIAGNOSIS — Z13220 Encounter for screening for lipoid disorders: Secondary | ICD-10-CM | POA: Diagnosis not present

## 2021-07-25 DIAGNOSIS — Z1321 Encounter for screening for nutritional disorder: Secondary | ICD-10-CM | POA: Diagnosis not present

## 2021-07-25 DIAGNOSIS — Z0000 Encounter for general adult medical examination without abnormal findings: Secondary | ICD-10-CM | POA: Diagnosis not present

## 2021-07-25 DIAGNOSIS — Z13228 Encounter for screening for other metabolic disorders: Secondary | ICD-10-CM | POA: Diagnosis not present

## 2021-07-25 DIAGNOSIS — Z7185 Encounter for immunization safety counseling: Secondary | ICD-10-CM | POA: Diagnosis not present

## 2021-07-25 DIAGNOSIS — Z1329 Encounter for screening for other suspected endocrine disorder: Secondary | ICD-10-CM | POA: Diagnosis not present

## 2021-07-25 DIAGNOSIS — Z130 Encounter for screening for diseases of the blood and blood-forming organs and certain disorders involving the immune mechanism: Secondary | ICD-10-CM | POA: Diagnosis not present

## 2021-08-01 DIAGNOSIS — J301 Allergic rhinitis due to pollen: Secondary | ICD-10-CM | POA: Diagnosis not present

## 2021-08-01 DIAGNOSIS — J3089 Other allergic rhinitis: Secondary | ICD-10-CM | POA: Diagnosis not present

## 2021-08-01 DIAGNOSIS — J3081 Allergic rhinitis due to animal (cat) (dog) hair and dander: Secondary | ICD-10-CM | POA: Diagnosis not present

## 2021-08-04 DIAGNOSIS — J3081 Allergic rhinitis due to animal (cat) (dog) hair and dander: Secondary | ICD-10-CM | POA: Diagnosis not present

## 2021-08-04 DIAGNOSIS — J3089 Other allergic rhinitis: Secondary | ICD-10-CM | POA: Diagnosis not present

## 2021-08-04 DIAGNOSIS — J301 Allergic rhinitis due to pollen: Secondary | ICD-10-CM | POA: Diagnosis not present

## 2021-08-13 ENCOUNTER — Other Ambulatory Visit (HOSPITAL_COMMUNITY): Payer: Self-pay

## 2021-08-13 DIAGNOSIS — J3081 Allergic rhinitis due to animal (cat) (dog) hair and dander: Secondary | ICD-10-CM | POA: Diagnosis not present

## 2021-08-13 DIAGNOSIS — J301 Allergic rhinitis due to pollen: Secondary | ICD-10-CM | POA: Diagnosis not present

## 2021-08-13 DIAGNOSIS — J3089 Other allergic rhinitis: Secondary | ICD-10-CM | POA: Diagnosis not present

## 2021-08-15 ENCOUNTER — Other Ambulatory Visit (HOSPITAL_COMMUNITY): Payer: Self-pay

## 2021-08-15 DIAGNOSIS — H1045 Other chronic allergic conjunctivitis: Secondary | ICD-10-CM | POA: Diagnosis not present

## 2021-08-15 DIAGNOSIS — J3089 Other allergic rhinitis: Secondary | ICD-10-CM | POA: Diagnosis not present

## 2021-08-15 DIAGNOSIS — T783XXD Angioneurotic edema, subsequent encounter: Secondary | ICD-10-CM | POA: Diagnosis not present

## 2021-08-15 DIAGNOSIS — R052 Subacute cough: Secondary | ICD-10-CM | POA: Diagnosis not present

## 2021-08-15 MED ORDER — LEVOCETIRIZINE DIHYDROCHLORIDE 5 MG PO TABS
5.0000 mg | ORAL_TABLET | Freq: Every evening | ORAL | 3 refills | Status: DC
  Filled 2021-08-15 – 2021-12-11 (×2): qty 30, 30d supply, fill #0
  Filled 2022-05-07: qty 30, 30d supply, fill #1

## 2021-08-20 DIAGNOSIS — J3089 Other allergic rhinitis: Secondary | ICD-10-CM | POA: Diagnosis not present

## 2021-08-20 DIAGNOSIS — J301 Allergic rhinitis due to pollen: Secondary | ICD-10-CM | POA: Diagnosis not present

## 2021-08-20 DIAGNOSIS — J3081 Allergic rhinitis due to animal (cat) (dog) hair and dander: Secondary | ICD-10-CM | POA: Diagnosis not present

## 2021-08-25 DIAGNOSIS — J3081 Allergic rhinitis due to animal (cat) (dog) hair and dander: Secondary | ICD-10-CM | POA: Diagnosis not present

## 2021-08-25 DIAGNOSIS — J301 Allergic rhinitis due to pollen: Secondary | ICD-10-CM | POA: Diagnosis not present

## 2021-08-25 DIAGNOSIS — J3089 Other allergic rhinitis: Secondary | ICD-10-CM | POA: Diagnosis not present

## 2021-09-01 DIAGNOSIS — J3081 Allergic rhinitis due to animal (cat) (dog) hair and dander: Secondary | ICD-10-CM | POA: Diagnosis not present

## 2021-09-01 DIAGNOSIS — J3089 Other allergic rhinitis: Secondary | ICD-10-CM | POA: Diagnosis not present

## 2021-09-01 DIAGNOSIS — J301 Allergic rhinitis due to pollen: Secondary | ICD-10-CM | POA: Diagnosis not present

## 2021-09-03 ENCOUNTER — Encounter (INDEPENDENT_AMBULATORY_CARE_PROVIDER_SITE_OTHER): Payer: Self-pay

## 2021-09-08 DIAGNOSIS — J301 Allergic rhinitis due to pollen: Secondary | ICD-10-CM | POA: Diagnosis not present

## 2021-09-09 DIAGNOSIS — J3081 Allergic rhinitis due to animal (cat) (dog) hair and dander: Secondary | ICD-10-CM | POA: Diagnosis not present

## 2021-09-09 DIAGNOSIS — J3089 Other allergic rhinitis: Secondary | ICD-10-CM | POA: Diagnosis not present

## 2021-09-10 ENCOUNTER — Other Ambulatory Visit (HOSPITAL_COMMUNITY): Payer: Self-pay

## 2021-09-10 DIAGNOSIS — J3089 Other allergic rhinitis: Secondary | ICD-10-CM | POA: Diagnosis not present

## 2021-09-10 DIAGNOSIS — J301 Allergic rhinitis due to pollen: Secondary | ICD-10-CM | POA: Diagnosis not present

## 2021-09-10 DIAGNOSIS — J3081 Allergic rhinitis due to animal (cat) (dog) hair and dander: Secondary | ICD-10-CM | POA: Diagnosis not present

## 2021-09-16 ENCOUNTER — Other Ambulatory Visit (HOSPITAL_COMMUNITY): Payer: Self-pay

## 2021-09-16 MED ORDER — SOD FLUORIDE-POTASSIUM NITRATE 1.1-5 % DT GEL
DENTAL | 5 refills | Status: DC
  Filled 2021-09-16 – 2021-10-13 (×2): qty 100, 30d supply, fill #0
  Filled 2021-11-06: qty 100, 30d supply, fill #1
  Filled 2021-12-11: qty 100, 30d supply, fill #2
  Filled 2022-02-05: qty 100, 30d supply, fill #3

## 2021-09-23 ENCOUNTER — Other Ambulatory Visit (INDEPENDENT_AMBULATORY_CARE_PROVIDER_SITE_OTHER): Payer: Self-pay | Admitting: Family Medicine

## 2021-09-23 DIAGNOSIS — E669 Obesity, unspecified: Secondary | ICD-10-CM

## 2021-09-25 DIAGNOSIS — J3081 Allergic rhinitis due to animal (cat) (dog) hair and dander: Secondary | ICD-10-CM | POA: Diagnosis not present

## 2021-09-25 DIAGNOSIS — J301 Allergic rhinitis due to pollen: Secondary | ICD-10-CM | POA: Diagnosis not present

## 2021-09-25 DIAGNOSIS — J3089 Other allergic rhinitis: Secondary | ICD-10-CM | POA: Diagnosis not present

## 2021-10-01 ENCOUNTER — Other Ambulatory Visit (HOSPITAL_COMMUNITY): Payer: Self-pay

## 2021-10-02 ENCOUNTER — Other Ambulatory Visit (INDEPENDENT_AMBULATORY_CARE_PROVIDER_SITE_OTHER): Payer: Self-pay | Admitting: Family Medicine

## 2021-10-02 ENCOUNTER — Other Ambulatory Visit (HOSPITAL_COMMUNITY): Payer: Self-pay

## 2021-10-02 DIAGNOSIS — J301 Allergic rhinitis due to pollen: Secondary | ICD-10-CM | POA: Diagnosis not present

## 2021-10-02 DIAGNOSIS — E669 Obesity, unspecified: Secondary | ICD-10-CM

## 2021-10-02 DIAGNOSIS — J3089 Other allergic rhinitis: Secondary | ICD-10-CM | POA: Diagnosis not present

## 2021-10-02 DIAGNOSIS — J3081 Allergic rhinitis due to animal (cat) (dog) hair and dander: Secondary | ICD-10-CM | POA: Diagnosis not present

## 2021-10-02 MED ORDER — WEGOVY 2.4 MG/0.75ML ~~LOC~~ SOAJ
2.4000 mg | SUBCUTANEOUS | 0 refills | Status: DC
  Filled 2021-10-02: qty 3, 28d supply, fill #0
  Filled 2021-11-06: qty 3, 28d supply, fill #1
  Filled 2021-12-11 – 2021-12-15 (×2): qty 3, 28d supply, fill #2

## 2021-10-10 DIAGNOSIS — J301 Allergic rhinitis due to pollen: Secondary | ICD-10-CM | POA: Diagnosis not present

## 2021-10-10 DIAGNOSIS — J3081 Allergic rhinitis due to animal (cat) (dog) hair and dander: Secondary | ICD-10-CM | POA: Diagnosis not present

## 2021-10-10 DIAGNOSIS — J3089 Other allergic rhinitis: Secondary | ICD-10-CM | POA: Diagnosis not present

## 2021-10-13 ENCOUNTER — Other Ambulatory Visit (HOSPITAL_COMMUNITY): Payer: Self-pay

## 2021-10-17 DIAGNOSIS — J3089 Other allergic rhinitis: Secondary | ICD-10-CM | POA: Diagnosis not present

## 2021-10-17 DIAGNOSIS — J3081 Allergic rhinitis due to animal (cat) (dog) hair and dander: Secondary | ICD-10-CM | POA: Diagnosis not present

## 2021-10-17 DIAGNOSIS — J301 Allergic rhinitis due to pollen: Secondary | ICD-10-CM | POA: Diagnosis not present

## 2021-10-23 DIAGNOSIS — J3081 Allergic rhinitis due to animal (cat) (dog) hair and dander: Secondary | ICD-10-CM | POA: Diagnosis not present

## 2021-10-23 DIAGNOSIS — J301 Allergic rhinitis due to pollen: Secondary | ICD-10-CM | POA: Diagnosis not present

## 2021-10-23 DIAGNOSIS — J3089 Other allergic rhinitis: Secondary | ICD-10-CM | POA: Diagnosis not present

## 2021-10-24 ENCOUNTER — Other Ambulatory Visit (HOSPITAL_COMMUNITY): Payer: Self-pay

## 2021-10-24 DIAGNOSIS — N926 Irregular menstruation, unspecified: Secondary | ICD-10-CM | POA: Diagnosis not present

## 2021-10-24 MED ORDER — JUNEL FE 24 1-20 MG-MCG(24) PO TABS
1.0000 | ORAL_TABLET | Freq: Every day | ORAL | 4 refills | Status: DC
  Filled 2021-10-24 – 2021-11-06 (×2): qty 84, 84d supply, fill #0

## 2021-11-03 ENCOUNTER — Other Ambulatory Visit (HOSPITAL_COMMUNITY): Payer: Self-pay

## 2021-11-06 ENCOUNTER — Other Ambulatory Visit (HOSPITAL_COMMUNITY): Payer: Self-pay

## 2021-11-06 DIAGNOSIS — J3081 Allergic rhinitis due to animal (cat) (dog) hair and dander: Secondary | ICD-10-CM | POA: Diagnosis not present

## 2021-11-06 DIAGNOSIS — J3089 Other allergic rhinitis: Secondary | ICD-10-CM | POA: Diagnosis not present

## 2021-11-06 DIAGNOSIS — J301 Allergic rhinitis due to pollen: Secondary | ICD-10-CM | POA: Diagnosis not present

## 2021-11-10 ENCOUNTER — Other Ambulatory Visit: Payer: Self-pay | Admitting: Obstetrics and Gynecology

## 2021-11-10 DIAGNOSIS — Z1231 Encounter for screening mammogram for malignant neoplasm of breast: Secondary | ICD-10-CM

## 2021-11-13 DIAGNOSIS — J301 Allergic rhinitis due to pollen: Secondary | ICD-10-CM | POA: Diagnosis not present

## 2021-11-13 DIAGNOSIS — J3081 Allergic rhinitis due to animal (cat) (dog) hair and dander: Secondary | ICD-10-CM | POA: Diagnosis not present

## 2021-11-13 DIAGNOSIS — J3089 Other allergic rhinitis: Secondary | ICD-10-CM | POA: Diagnosis not present

## 2021-11-27 DIAGNOSIS — J3081 Allergic rhinitis due to animal (cat) (dog) hair and dander: Secondary | ICD-10-CM | POA: Diagnosis not present

## 2021-11-27 DIAGNOSIS — J3089 Other allergic rhinitis: Secondary | ICD-10-CM | POA: Diagnosis not present

## 2021-11-27 DIAGNOSIS — J301 Allergic rhinitis due to pollen: Secondary | ICD-10-CM | POA: Diagnosis not present

## 2021-12-04 DIAGNOSIS — J301 Allergic rhinitis due to pollen: Secondary | ICD-10-CM | POA: Diagnosis not present

## 2021-12-04 DIAGNOSIS — J3089 Other allergic rhinitis: Secondary | ICD-10-CM | POA: Diagnosis not present

## 2021-12-04 DIAGNOSIS — J3081 Allergic rhinitis due to animal (cat) (dog) hair and dander: Secondary | ICD-10-CM | POA: Diagnosis not present

## 2021-12-11 ENCOUNTER — Other Ambulatory Visit: Payer: Self-pay | Admitting: Adult Health

## 2021-12-11 ENCOUNTER — Other Ambulatory Visit (HOSPITAL_COMMUNITY): Payer: Self-pay

## 2021-12-11 DIAGNOSIS — J3089 Other allergic rhinitis: Secondary | ICD-10-CM | POA: Diagnosis not present

## 2021-12-11 DIAGNOSIS — J301 Allergic rhinitis due to pollen: Secondary | ICD-10-CM | POA: Diagnosis not present

## 2021-12-11 DIAGNOSIS — J3081 Allergic rhinitis due to animal (cat) (dog) hair and dander: Secondary | ICD-10-CM | POA: Diagnosis not present

## 2021-12-11 MED ORDER — ONDANSETRON HCL 8 MG PO TABS
8.0000 mg | ORAL_TABLET | Freq: Three times a day (TID) | ORAL | 0 refills | Status: DC | PRN
  Filled 2021-12-11: qty 20, 7d supply, fill #0

## 2021-12-15 ENCOUNTER — Other Ambulatory Visit (HOSPITAL_COMMUNITY): Payer: Self-pay

## 2021-12-16 ENCOUNTER — Other Ambulatory Visit (HOSPITAL_COMMUNITY): Payer: Self-pay

## 2022-01-02 ENCOUNTER — Ambulatory Visit
Admission: RE | Admit: 2022-01-02 | Discharge: 2022-01-02 | Disposition: A | Discharge status: Home / Self Care | Payer: 59 | Source: Ambulatory Visit

## 2022-01-02 DIAGNOSIS — Z1231 Encounter for screening mammogram for malignant neoplasm of breast: Secondary | ICD-10-CM | POA: Diagnosis not present

## 2022-01-05 ENCOUNTER — Other Ambulatory Visit (INDEPENDENT_AMBULATORY_CARE_PROVIDER_SITE_OTHER): Payer: Self-pay | Admitting: Family Medicine

## 2022-01-05 DIAGNOSIS — E669 Obesity, unspecified: Secondary | ICD-10-CM

## 2022-01-08 ENCOUNTER — Other Ambulatory Visit (HOSPITAL_COMMUNITY): Payer: Self-pay

## 2022-01-08 ENCOUNTER — Encounter (HOSPITAL_COMMUNITY): Payer: Self-pay

## 2022-01-08 ENCOUNTER — Other Ambulatory Visit (INDEPENDENT_AMBULATORY_CARE_PROVIDER_SITE_OTHER): Payer: Self-pay | Admitting: Family Medicine

## 2022-01-08 DIAGNOSIS — E669 Obesity, unspecified: Secondary | ICD-10-CM

## 2022-01-08 MED ORDER — WEGOVY 2.4 MG/0.75ML ~~LOC~~ SOAJ
2.4000 mg | SUBCUTANEOUS | 0 refills | Status: DC
  Filled 2022-01-08: qty 3, 28d supply, fill #0
  Filled 2022-02-05: qty 3, 28d supply, fill #1
  Filled 2022-03-04: qty 3, 28d supply, fill #2

## 2022-01-29 DIAGNOSIS — J301 Allergic rhinitis due to pollen: Secondary | ICD-10-CM | POA: Diagnosis not present

## 2022-01-29 DIAGNOSIS — J3081 Allergic rhinitis due to animal (cat) (dog) hair and dander: Secondary | ICD-10-CM | POA: Diagnosis not present

## 2022-01-29 DIAGNOSIS — J3089 Other allergic rhinitis: Secondary | ICD-10-CM | POA: Diagnosis not present

## 2022-02-09 ENCOUNTER — Other Ambulatory Visit (HOSPITAL_COMMUNITY): Payer: Self-pay

## 2022-02-12 DIAGNOSIS — J3089 Other allergic rhinitis: Secondary | ICD-10-CM | POA: Diagnosis not present

## 2022-02-12 DIAGNOSIS — J301 Allergic rhinitis due to pollen: Secondary | ICD-10-CM | POA: Diagnosis not present

## 2022-02-12 DIAGNOSIS — J3081 Allergic rhinitis due to animal (cat) (dog) hair and dander: Secondary | ICD-10-CM | POA: Diagnosis not present

## 2022-02-26 DIAGNOSIS — J3089 Other allergic rhinitis: Secondary | ICD-10-CM | POA: Diagnosis not present

## 2022-02-26 DIAGNOSIS — J3081 Allergic rhinitis due to animal (cat) (dog) hair and dander: Secondary | ICD-10-CM | POA: Diagnosis not present

## 2022-02-26 DIAGNOSIS — J301 Allergic rhinitis due to pollen: Secondary | ICD-10-CM | POA: Diagnosis not present

## 2022-03-05 DIAGNOSIS — J3089 Other allergic rhinitis: Secondary | ICD-10-CM | POA: Diagnosis not present

## 2022-03-05 DIAGNOSIS — J301 Allergic rhinitis due to pollen: Secondary | ICD-10-CM | POA: Diagnosis not present

## 2022-03-05 DIAGNOSIS — J3081 Allergic rhinitis due to animal (cat) (dog) hair and dander: Secondary | ICD-10-CM | POA: Diagnosis not present

## 2022-03-06 DIAGNOSIS — H1045 Other chronic allergic conjunctivitis: Secondary | ICD-10-CM | POA: Diagnosis not present

## 2022-03-06 DIAGNOSIS — R21 Rash and other nonspecific skin eruption: Secondary | ICD-10-CM | POA: Diagnosis not present

## 2022-03-06 DIAGNOSIS — J3089 Other allergic rhinitis: Secondary | ICD-10-CM | POA: Diagnosis not present

## 2022-03-06 DIAGNOSIS — R052 Subacute cough: Secondary | ICD-10-CM | POA: Diagnosis not present

## 2022-03-06 DIAGNOSIS — T783XXD Angioneurotic edema, subsequent encounter: Secondary | ICD-10-CM | POA: Diagnosis not present

## 2022-03-19 DIAGNOSIS — J3089 Other allergic rhinitis: Secondary | ICD-10-CM | POA: Diagnosis not present

## 2022-03-19 DIAGNOSIS — J3081 Allergic rhinitis due to animal (cat) (dog) hair and dander: Secondary | ICD-10-CM | POA: Diagnosis not present

## 2022-03-19 DIAGNOSIS — J301 Allergic rhinitis due to pollen: Secondary | ICD-10-CM | POA: Diagnosis not present

## 2022-03-26 ENCOUNTER — Other Ambulatory Visit (HOSPITAL_COMMUNITY): Payer: Self-pay

## 2022-03-26 ENCOUNTER — Other Ambulatory Visit (INDEPENDENT_AMBULATORY_CARE_PROVIDER_SITE_OTHER): Payer: Self-pay | Admitting: Family Medicine

## 2022-03-26 DIAGNOSIS — E669 Obesity, unspecified: Secondary | ICD-10-CM

## 2022-03-26 MED ORDER — WEGOVY 2.4 MG/0.75ML ~~LOC~~ SOAJ
2.4000 mg | SUBCUTANEOUS | 0 refills | Status: DC
  Filled 2022-03-26: qty 9, 84d supply, fill #0

## 2022-03-28 ENCOUNTER — Other Ambulatory Visit (HOSPITAL_COMMUNITY): Payer: Self-pay

## 2022-03-30 ENCOUNTER — Other Ambulatory Visit (HOSPITAL_COMMUNITY): Payer: Self-pay

## 2022-04-02 DIAGNOSIS — J3081 Allergic rhinitis due to animal (cat) (dog) hair and dander: Secondary | ICD-10-CM | POA: Diagnosis not present

## 2022-04-02 DIAGNOSIS — J3089 Other allergic rhinitis: Secondary | ICD-10-CM | POA: Diagnosis not present

## 2022-04-02 DIAGNOSIS — J301 Allergic rhinitis due to pollen: Secondary | ICD-10-CM | POA: Diagnosis not present

## 2022-05-07 ENCOUNTER — Other Ambulatory Visit (INDEPENDENT_AMBULATORY_CARE_PROVIDER_SITE_OTHER): Payer: Self-pay | Admitting: Family Medicine

## 2022-05-07 ENCOUNTER — Other Ambulatory Visit: Payer: Self-pay

## 2022-05-07 ENCOUNTER — Other Ambulatory Visit (HOSPITAL_COMMUNITY): Payer: Self-pay

## 2022-05-07 DIAGNOSIS — J301 Allergic rhinitis due to pollen: Secondary | ICD-10-CM

## 2022-05-08 DIAGNOSIS — Z0000 Encounter for general adult medical examination without abnormal findings: Secondary | ICD-10-CM | POA: Diagnosis not present

## 2022-05-11 ENCOUNTER — Other Ambulatory Visit (HOSPITAL_COMMUNITY): Payer: Self-pay

## 2022-05-11 MED ORDER — VITAMIN D (ERGOCALCIFEROL) 1.25 MG (50000 UNIT) PO CAPS
50000.0000 [IU] | ORAL_CAPSULE | ORAL | 0 refills | Status: DC
  Filled 2022-05-11: qty 8, 56d supply, fill #0

## 2022-05-11 MED ORDER — CYANOCOBALAMIN 1000 MCG/ML IJ SOLN
INTRAMUSCULAR | 0 refills | Status: DC
  Filled 2022-05-11: qty 3, 90d supply, fill #0
  Filled 2022-05-25: qty 3, 90d supply, fill #1

## 2022-05-14 ENCOUNTER — Other Ambulatory Visit (HOSPITAL_COMMUNITY): Payer: Self-pay

## 2022-05-14 MED ORDER — CYANOCOBALAMIN 1000 MCG/ML IJ SOLN
1000.0000 ug | INTRAMUSCULAR | 0 refills | Status: DC
  Filled 2022-05-14: qty 3, 84d supply, fill #0
  Filled 2022-06-04: qty 3, 90d supply, fill #0

## 2022-05-18 ENCOUNTER — Other Ambulatory Visit (HOSPITAL_COMMUNITY): Payer: Self-pay

## 2022-05-25 ENCOUNTER — Other Ambulatory Visit (HOSPITAL_COMMUNITY): Payer: Self-pay

## 2022-05-27 ENCOUNTER — Other Ambulatory Visit: Payer: Self-pay

## 2022-05-27 ENCOUNTER — Other Ambulatory Visit (HOSPITAL_COMMUNITY): Payer: Self-pay

## 2022-05-27 MED ORDER — CYANOCOBALAMIN 1000 MCG/ML IJ SOLN: 1000.0000 ug | INTRAMUSCULAR | 0 refills | Status: DC

## 2022-06-04 ENCOUNTER — Other Ambulatory Visit (HOSPITAL_COMMUNITY): Payer: Self-pay

## 2022-12-18 ENCOUNTER — Other Ambulatory Visit (HOSPITAL_COMMUNITY): Payer: Self-pay

## 2022-12-18 DIAGNOSIS — Z6831 Body mass index (BMI) 31.0-31.9, adult: Secondary | ICD-10-CM | POA: Diagnosis not present

## 2022-12-18 DIAGNOSIS — Z01419 Encounter for gynecological examination (general) (routine) without abnormal findings: Secondary | ICD-10-CM | POA: Diagnosis not present

## 2022-12-18 DIAGNOSIS — Z1231 Encounter for screening mammogram for malignant neoplasm of breast: Secondary | ICD-10-CM | POA: Diagnosis not present

## 2022-12-18 DIAGNOSIS — N926 Irregular menstruation, unspecified: Secondary | ICD-10-CM | POA: Diagnosis not present

## 2022-12-18 MED ORDER — LO LOESTRIN FE 1 MG-10 MCG / 10 MCG PO TABS
1.0000 | ORAL_TABLET | Freq: Every day | ORAL | 3 refills | Status: DC
  Filled 2022-12-18: qty 84, 84d supply, fill #0
  Filled 2023-02-22 – 2023-02-24 (×3): qty 84, 84d supply, fill #1
  Filled 2023-05-04 – 2023-05-12 (×2): qty 84, 84d supply, fill #2
  Filled 2023-07-26 – 2023-07-31 (×2): qty 84, 84d supply, fill #3
  Filled 2023-08-02: qty 84, 63d supply, fill #3

## 2022-12-22 ENCOUNTER — Other Ambulatory Visit (HOSPITAL_COMMUNITY): Payer: Self-pay

## 2023-01-18 ENCOUNTER — Ambulatory Visit (INDEPENDENT_AMBULATORY_CARE_PROVIDER_SITE_OTHER): Payer: Commercial Managed Care - PPO | Admitting: Audiology

## 2023-01-18 ENCOUNTER — Encounter (INDEPENDENT_AMBULATORY_CARE_PROVIDER_SITE_OTHER): Payer: Self-pay

## 2023-01-18 ENCOUNTER — Ambulatory Visit (INDEPENDENT_AMBULATORY_CARE_PROVIDER_SITE_OTHER): Payer: Commercial Managed Care - PPO | Admitting: Otolaryngology

## 2023-01-18 VITALS — Ht 62.0 in | Wt 165.0 lb

## 2023-01-18 DIAGNOSIS — H9192 Unspecified hearing loss, left ear: Secondary | ICD-10-CM

## 2023-01-18 DIAGNOSIS — H6983 Other specified disorders of Eustachian tube, bilateral: Secondary | ICD-10-CM

## 2023-01-18 DIAGNOSIS — H900 Conductive hearing loss, bilateral: Secondary | ICD-10-CM

## 2023-01-18 DIAGNOSIS — J310 Chronic rhinitis: Secondary | ICD-10-CM

## 2023-01-18 DIAGNOSIS — H6523 Chronic serous otitis media, bilateral: Secondary | ICD-10-CM | POA: Diagnosis not present

## 2023-01-18 NOTE — Progress Notes (Signed)
  [address], [address] [place], [place] [address] [phone]  Audiological Evaluation    Name: Jenny Porter     DOB:   Mar 19, 1980      MRN:   [id number]                                                                                     Service Date: 01/18/2023     Accompanied by: unaccompanied   Patient comes today after Dr. Suszanne Conners, ENT sent a referral for a hearing evaluation due to concerns with hearing loss.   Symptoms Yes Details  Hearing loss  [x]  Perceives difficulty hearing males voices. Perceives can hear better females.  Tinnitus  []    Ear pain/ Ear infections  [x]  Reports recurrent ear infections, not with an active one today.  Balance problems  []    Noise exposure  []    Previous ear surgeries  []  None as an adult  Family history  []    Amplification  []    Other  []      Otoscopy: Right ear: Clear external ear canals and notable landmarks visualized on the tympanic membrane. Left ear:  Clear external ear canals and notable landmarks visualized on the tympanic membrane.  Tympanometry: Right ear: Type Ad- Normal external ear canal volume with normal middle ear pressure and high tympanic membrane compliance. Left ear: Type Ad- Normal external ear canal volume with normal middle ear pressure and high tympanic membrane compliance.   Pure tone Audiometry: Right ear- Borderline normal rising to normal hearing from [phone] Hz. Air-bone gaps (conductive components) noted at 500, 1k, and 4k Hz.   Left ear-  Mild hearing loss rising to normal hearing from [phone] Hz. Air-bone gaps (conductive components) noted at 500, 1k, and 4k Hz   The hearing test results were completed under headphones and re-checked with inserts and results are deemed to be of good reliability. Test technique:  conventional     Speech Audiometry: Right ear- Speech Reception Threshold (SRT) was obtained at 15 dBHL. Left ear-Speech Reception Threshold (SRT) was obtained at 10 dBHL.    Word Recognition Score Tested using NU-6 (MLV) Right ear: 100% was obtained at a presentation level of 55 dBHL with contralateral masking which is deemed as  excellent Left ear: 100% was obtained at a presentation level of 55 dBHL with contralateral masking which is deemed as  excellent    Impression: There is not a significant difference in pure-tone thresholds between ears. There is not a significant difference in the word recognition score in between ears.    Recommendations: Follow up with ENT as scheduled for today. Return for a hearing evaluation if concerns with hearing changes arise or per MD recommendation. Use of communication strategies, when needed. Amplification may be potentially considered pending medical clearance and patient's interest.   Jamerius Boeckman MARIE LEROUX-MARTINEZ, AUD

## 2023-01-19 DIAGNOSIS — H900 Conductive hearing loss, bilateral: Secondary | ICD-10-CM | POA: Insufficient documentation

## 2023-01-19 DIAGNOSIS — J310 Chronic rhinitis: Secondary | ICD-10-CM | POA: Insufficient documentation

## 2023-01-19 DIAGNOSIS — H6523 Chronic serous otitis media, bilateral: Secondary | ICD-10-CM | POA: Insufficient documentation

## 2023-01-19 DIAGNOSIS — H6983 Other specified disorders of Eustachian tube, bilateral: Secondary | ICD-10-CM | POA: Insufficient documentation

## 2023-01-19 NOTE — Progress Notes (Signed)
Patient ID: Jenny Porter, female   DOB: [birth year], 42 y.o.   MRN: [id number]  CC: Recurrent ear infections, hearing loss  HPI:  Jenny Porter is a 42 y.o. female who presents today complaining of recurrent ear infections.  She has been having 2-3 episodes of ear infections a year.  Her symptoms include ear pressure, ear pain, and muffled hearing.  She was treated with multiple courses of antibiotics.  She just completed her Augmentin today.  She has a history of recurrent childhood otitis media.  She was treated with bilateral myringotomy and tube placement.  She also has a history of environmental allergies.  She was treated with immunotherapy, Xyzal, azelastine, and Flonase.  She is using her medications as needed.  Currently she denies any otalgia, otorrhea, facial pain, fever, or visual change.  Past Medical History:  Diagnosis Date   Anemia    Asthma    rare inhaler use   Heartburn in pregnancy    No pertinent past medical history    Posterior tibial tendinitis     Past Surgical History:  Procedure Laterality Date   BREAST BIOPSY Right 11/27/2019   CESAREAN SECTION     CESAREAN SECTION  06/15/2011   Procedure: CESAREAN SECTION;  Surgeon: Zelphia Cairo, MD;  Location: [facility];  Service: Gynecology;  Laterality: N/A;  REPEAT EDC 5/25   CESAREAN SECTION WITH BILATERAL TUBAL LIGATION N/A 08/05/2013   Procedure: REPEAT CESAREAN SECTION WITH BILATERAL TUBAL LIGATION;  Surgeon: Zelphia Cairo, MD;  Location: [facility];  Service: Obstetrics;  Laterality: N/A;   DILATION AND CURETTAGE OF UTERUS      Family History  Problem Relation Age of Onset   Heart disease Paternal Grandmother    Obesity Mother    Obesity Father    Anesthesia problems Neg Hx    Hypotension Neg Hx    Pseudochol deficiency Neg Hx    Malignant hyperthermia Neg Hx     Social History:  reports that she has never smoked. She has never used smokeless tobacco. She reports that she does not drink  alcohol and does not use drugs.  Allergies: No Active Allergies  Prior to Admission medications   Medication Sig Start Date End Date Taking? Authorizing Provider  Norethindrone-Ethinyl Estradiol-Fe Biphas (LO LOESTRIN FE) 1 MG-10 MCG / 10 MCG tablet Take 1 tablet by mouth daily. 12/18/22  Yes   ondansetron (ZOFRAN) 8 MG tablet Take 1 tablet (8 mg total) by mouth every 8 (eight) hours as needed for nausea or vomiting. Patient not taking: Reported on 01/18/2023 12/11/21   Shirline Frees, NP  Azelastine HCl 0.15 % SOLN Place 2 sprays into the nose daily. 04/17/21   Langston Reusing, MD  cyanocobalamin (VITAMIN B12) 1000 MCG/ML injection Inject 1 ml into the muscle every 30 days. Patient not taking: Reported on 01/18/2023 05/11/22     cyanocobalamin (VITAMIN B12) 1000 MCG/ML injection Inject 1 mL (1,000 mcg total) into the muscle every 30 (thirty) days. Patient not taking: Reported on 01/18/2023 05/14/22     cyanocobalamin (VITAMIN B12) 1000 MCG/ML injection Inject 1 mL (1,000 mcg total) into the muscle every 30 (thirty) days. Patient not taking: Reported on 01/18/2023 05/27/22     Diclofenac Sodium 2 % SOLN Apply 1 pump twice daily. 08/08/18   Judi Saa, DO  levocetirizine (XYZAL) 5 MG tablet Take 1 tablet (5 mg total) by mouth once a day in the evening. Patient not taking: Reported on 01/18/2023 08/15/21  meloxicam (MOBIC) 7.5 MG tablet Take 1 tablet (7.5 mg total) by mouth daily. 02/11/21   Judi Saa, DO  multivitamin-iron-minerals-folic acid (CENTRUM) chewable tablet Chew 1 tablet by mouth daily. 09/13/17   Quillian Quince D, MD  nitroGLYCERIN (NITRODUR - DOSED IN MG/24 HR) 0.2 mg/hr patch 1/4 patch daily 03/07/18   Judi Saa, DO  Norethindrone Acetate-Ethinyl Estrad-FE (JUNEL FE 24) 1-20 MG-MCG(24) tablet Take 1 tablet by mouth daily. 10/24/21     Semaglutide-Weight Management (WEGOVY) 2.4 MG/0.75ML SOAJ Inject 2.4 mg into the skin once a week. Patient not taking: Reported on  01/18/2023 03/26/22   Langston Reusing, MD  Sod Fluoride-Potassium Nitrate 1.1-5 % GEL Brush teeth twice a day 09/15/21     Vitamin D, Ergocalciferol, (DRISDOL) 1.25 MG (50000 UNIT) CAPS capsule Take 1 capsule (50,000 Units total) by mouth once a week. Patient not taking: Reported on 01/18/2023 05/11/22     Vitamin D, Ergocalciferol, (DRISDOL) 1.25 MG (50000 UT) CAPS capsule Take 1 capsule (50,000 Units total) by mouth every 7 (seven) days. Patient not taking: Reported on 01/18/2023 03/07/18   Judi Saa, DO    Height 5\' 2"  (1.575 m), weight 165 lb (74.8 kg), currently breastfeeding. Exam: General: Communicates without difficulty, well nourished, no acute distress. Head: Normocephalic, no evidence injury, no tenderness, facial buttresses intact without stepoff. Face/sinus: No tenderness to palpation and percussion. Facial movement is normal and symmetric. Eyes: PERRL, EOMI. No scleral icterus, conjunctivae clear. Neuro: CN II exam reveals vision grossly intact.  No nystagmus at any point of gaze. Ears: Auricles well formed without lesions.  Ear canals are intact without mass or lesion.  No erythema or edema is appreciated.  The TMs are intact without fluid. Nose: External evaluation reveals normal support and skin without lesions.  Dorsum is intact.  Anterior rhinoscopy reveals congested mucosa over anterior aspect of inferior turbinates and intact septum.  No purulence noted. Oral:  Oral cavity and oropharynx are intact, symmetric, without erythema or edema.  Mucosa is moist without lesions. Neck: Full range of motion without pain.  There is no significant lymphadenopathy.  No masses palpable.  Thyroid bed within normal limits to palpation.  Parotid glands and submandibular glands equal bilaterally without mass.  Trachea is midline. Neuro:  CN 2-12 grossly intact.   Her hearing test shows minimal bilateral conductive hearing loss.  Assessment: 1.  Chronic/allergic rhinitis, with edematous nasal  mucosa. 2.  History of recurrent otitis media.  Her most recent acute infection has resolved.  Her ear canals, tympanic membranes, and middle ear spaces are noted to be normal today. 3.  Bilateral minimal conductive hearing loss, possibly a result of eustachian tube dysfunction.  Plan: 1.  The physical exam findings are reviewed with the patient.  Hearing test results also reviewed. 2.  The patient is reassured that her recent acute infection has resolved. 3.  Flonase nasal spray 2 sprays each nostril daily daily.  The importance of consistent daily use is discussed. 4.  If the patient continues to be symptomatic, she may benefit from undergoing myringotomy and tube placement procedure. 5.  The patient is encouraged to call with any questions or concerns.  Jenny Porter 01/19/2023, 10:21 AM

## 2023-02-05 DIAGNOSIS — H524 Presbyopia: Secondary | ICD-10-CM | POA: Diagnosis not present

## 2023-02-11 ENCOUNTER — Encounter (INDEPENDENT_AMBULATORY_CARE_PROVIDER_SITE_OTHER): Payer: Self-pay | Admitting: Family Medicine

## 2023-02-11 ENCOUNTER — Other Ambulatory Visit (HOSPITAL_COMMUNITY): Payer: Self-pay

## 2023-02-11 ENCOUNTER — Ambulatory Visit (INDEPENDENT_AMBULATORY_CARE_PROVIDER_SITE_OTHER): Payer: Commercial Managed Care - PPO | Admitting: Family Medicine

## 2023-02-11 ENCOUNTER — Other Ambulatory Visit (INDEPENDENT_AMBULATORY_CARE_PROVIDER_SITE_OTHER): Payer: Self-pay | Admitting: Family Medicine

## 2023-02-11 VITALS — BP 112/69 | HR 94 | Temp 98.3°F | Ht 62.0 in | Wt 173.0 lb

## 2023-02-11 DIAGNOSIS — E669 Obesity, unspecified: Secondary | ICD-10-CM

## 2023-02-11 DIAGNOSIS — R0602 Shortness of breath: Secondary | ICD-10-CM

## 2023-02-11 DIAGNOSIS — E66811 Obesity, class 1: Secondary | ICD-10-CM

## 2023-02-11 DIAGNOSIS — Z6831 Body mass index (BMI) 31.0-31.9, adult: Secondary | ICD-10-CM

## 2023-02-11 MED ORDER — ZEPBOUND 15 MG/0.5ML ~~LOC~~ SOAJ: 15.0000 mg | SUBCUTANEOUS | 0 refills | Status: DC

## 2023-02-11 MED ORDER — ZEPBOUND 15 MG/0.5ML ~~LOC~~ SOAJ
15.0000 mg | SUBCUTANEOUS | 0 refills | Status: DC
  Filled 2023-02-11: qty 2, 28d supply, fill #0

## 2023-02-11 NOTE — Progress Notes (Unsigned)
   SUBJECTIVE:  Chief Complaint: Obesity  Interim History: Patient is here for follow-up for her obesity management.  She was previously on injectable GLP-1 therapy however due to lack of insurance coverage was taken off last year.  She had significant success with these medications noticing a significant weight loss and consistency in her food intake as well as macronutrient composition of that food intake.  Now off these medications she has started noticing weight regain.  Jenny Porter is here to discuss her progress with her obesity treatment plan. She is not on the  meal plan . She states she is exercising 30 minutes 5 times per week.   OBJECTIVE: Visit Diagnoses: Problem List Items Addressed This Visit   None   No data recorded Anthropometric Measurements Starting Weight: 198 lb   No data recorded No data recorded   ASSESSMENT AND PLAN:  Diet: Jenny Porter {CHL AMB IS/IS NOT:210130109} currently in the action stage of change. As such, her goal is to {HWW Weight Loss Efforts:210964006}. She {HAS HAS ZOX:09604} agreed to {HWW Weight Loss Plan:210964005}.  Exercise: Jenny Porter has been instructed {HWW Exercise:210964007} for weight loss and overall health benefits.   Behavior Modification:  We discussed the following Behavioral Modification Strategies today: {HWW Behavior Modification:210964008}. We discussed various medication options to help Jenny Porter with her weight loss efforts and we both agreed to ***.  No follow-ups on file.Marland Kitchen She was informed of the importance of frequent follow up visits to maximize her success with intensive lifestyle modifications for her multiple health conditions.  Attestation Statements:   Reviewed by clinician on day of visit: allergies, medications, problem list, medical history, surgical history, family history, social history, and previous encounter notes.   Time spent on visit including pre-visit chart review and post-visit care and charting was ***  minutes.    Reuben Likes, MD

## 2023-02-15 NOTE — Telephone Encounter (Signed)
Please review

## 2023-02-16 ENCOUNTER — Other Ambulatory Visit: Payer: Self-pay | Admitting: Adult Health

## 2023-02-16 MED ORDER — AMOXICILLIN-POT CLAVULANATE 875-125 MG PO TABS: 1.0000 | ORAL_TABLET | Freq: Two times a day (BID) | ORAL | 0 refills | Status: DC

## 2023-02-17 DIAGNOSIS — E669 Obesity, unspecified: Secondary | ICD-10-CM | POA: Insufficient documentation

## 2023-02-17 NOTE — Assessment & Plan Note (Signed)
Resting metabolic rate on initial visit in 2019 was 1662 and repeat resting metabolic rate at appointment today by indirect calorimetry was found to be 1800.  Patient has been working on food intake focusing on calories and protein.  She has been consistently getting up at 4:30 in the morning to exercise almost daily.  She was previously treated with GLP-1/incretin therapy medications and did exceedingly well at weight loss with preservation of muscle mass.  However she lost insurance coverage and is no longer on medication.  She is noticing weight regain even with her focus on nutrition and exercise.

## 2023-02-17 NOTE — Assessment & Plan Note (Signed)
Discussed various medications at length today.  Patient was previously on GLP-1/incretin injectable therapy with great success.  We discussed how when people are normally on medications and then have a sudden cessation they often if going back on them require higher dosages to achieve the same effect.  She was able to get up to 10 mg of Mounjaro and was previously on the highest dose of Wegovy.  She will need a much higher dose of Mounjaro to achieve the same effect.  Prescription for 15 mg of Zepbound was sent in as patient is going to use the manufactures coupon.

## 2023-02-17 NOTE — Telephone Encounter (Signed)
I cannot disclose patient information due to HIPAA

## 2023-02-22 ENCOUNTER — Other Ambulatory Visit (HOSPITAL_COMMUNITY): Payer: Self-pay

## 2023-02-22 ENCOUNTER — Other Ambulatory Visit (INDEPENDENT_AMBULATORY_CARE_PROVIDER_SITE_OTHER): Payer: Self-pay | Admitting: Family Medicine

## 2023-02-22 DIAGNOSIS — J301 Allergic rhinitis due to pollen: Secondary | ICD-10-CM

## 2023-02-22 MED ORDER — LEVOCETIRIZINE DIHYDROCHLORIDE 5 MG PO TABS
5.0000 mg | ORAL_TABLET | Freq: Every evening | ORAL | 0 refills | Status: AC
  Filled 2023-02-22: qty 30, 30d supply, fill #0

## 2023-02-23 ENCOUNTER — Other Ambulatory Visit (HOSPITAL_COMMUNITY): Payer: Self-pay

## 2023-02-23 MED ORDER — LEVOCETIRIZINE DIHYDROCHLORIDE 5 MG PO TABS
5.0000 mg | ORAL_TABLET | Freq: Every day | ORAL | 1 refills | Status: AC
  Filled 2023-02-23 – 2023-03-18 (×3): qty 30, 30d supply, fill #0
  Filled 2023-06-29 – 2023-07-02 (×2): qty 30, 30d supply, fill #1

## 2023-02-23 MED ORDER — FLUTICASONE PROPIONATE 50 MCG/ACT NA SUSP
Freq: Every day | NASAL | 1 refills | Status: AC
  Filled 2023-02-23: qty 16, 30d supply, fill #0

## 2023-02-24 ENCOUNTER — Other Ambulatory Visit: Payer: Self-pay

## 2023-03-18 ENCOUNTER — Other Ambulatory Visit: Payer: Self-pay

## 2023-03-18 ENCOUNTER — Other Ambulatory Visit (HOSPITAL_COMMUNITY): Payer: Self-pay

## 2023-04-06 ENCOUNTER — Other Ambulatory Visit (HOSPITAL_COMMUNITY): Payer: Self-pay

## 2023-04-13 ENCOUNTER — Other Ambulatory Visit (HOSPITAL_COMMUNITY): Payer: Self-pay

## 2023-04-13 DIAGNOSIS — F411 Generalized anxiety disorder: Secondary | ICD-10-CM | POA: Diagnosis not present

## 2023-04-13 MED ORDER — SERTRALINE HCL 50 MG PO TABS
50.0000 mg | ORAL_TABLET | Freq: Every day | ORAL | 0 refills | Status: DC
  Filled 2023-04-13: qty 90, 90d supply, fill #0

## 2023-04-16 ENCOUNTER — Other Ambulatory Visit (HOSPITAL_COMMUNITY): Payer: Self-pay

## 2023-04-16 DIAGNOSIS — T783XXA Angioneurotic edema, initial encounter: Secondary | ICD-10-CM | POA: Diagnosis not present

## 2023-04-16 DIAGNOSIS — H1045 Other chronic allergic conjunctivitis: Secondary | ICD-10-CM | POA: Diagnosis not present

## 2023-04-16 DIAGNOSIS — J3089 Other allergic rhinitis: Secondary | ICD-10-CM | POA: Diagnosis not present

## 2023-04-16 DIAGNOSIS — R052 Subacute cough: Secondary | ICD-10-CM | POA: Diagnosis not present

## 2023-04-16 MED ORDER — FLUTICASONE PROPIONATE 50 MCG/ACT NA SUSP
NASAL | 3 refills | Status: AC
  Filled 2023-04-16: qty 48, 90d supply, fill #0

## 2023-04-16 MED ORDER — LEVOCETIRIZINE DIHYDROCHLORIDE 5 MG PO TABS
5.0000 mg | ORAL_TABLET | Freq: Every evening | ORAL | 3 refills | Status: AC
  Filled 2023-04-16: qty 90, 90d supply, fill #0
  Filled 2023-08-16: qty 90, 90d supply, fill #1
  Filled 2023-09-28 – 2023-11-29 (×4): qty 90, 90d supply, fill #2
  Filled 2024-02-17: qty 90, 90d supply, fill #3

## 2023-05-04 ENCOUNTER — Other Ambulatory Visit (HOSPITAL_COMMUNITY): Payer: Self-pay

## 2023-05-22 ENCOUNTER — Other Ambulatory Visit (HOSPITAL_COMMUNITY): Payer: Self-pay

## 2023-06-29 ENCOUNTER — Other Ambulatory Visit (HOSPITAL_COMMUNITY): Payer: Self-pay

## 2023-06-30 ENCOUNTER — Other Ambulatory Visit (HOSPITAL_COMMUNITY): Payer: Self-pay

## 2023-06-30 MED ORDER — SERTRALINE HCL 50 MG PO TABS
50.0000 mg | ORAL_TABLET | Freq: Every day | ORAL | 1 refills | Status: DC
  Filled 2023-06-30: qty 90, 90d supply, fill #0
  Filled 2023-09-28: qty 90, 90d supply, fill #1

## 2023-07-01 ENCOUNTER — Other Ambulatory Visit (HOSPITAL_COMMUNITY): Payer: Self-pay

## 2023-07-02 ENCOUNTER — Other Ambulatory Visit (HOSPITAL_COMMUNITY): Payer: Self-pay

## 2023-07-26 ENCOUNTER — Other Ambulatory Visit (HOSPITAL_COMMUNITY): Payer: Self-pay

## 2023-08-02 ENCOUNTER — Other Ambulatory Visit (HOSPITAL_COMMUNITY): Payer: Self-pay

## 2023-08-04 IMAGING — MG DIGITAL DIAGNOSTIC BILAT W/ TOMO W/ CAD
8 series · 8 of 24 positions shown · non-contrast
Comparison: Previous exam(s).

CLINICAL DATA: 40-year-old female presenting for annual exam as
well as follow-up of probably benign left breast masses.

EXAM:
DIGITAL DIAGNOSTIC BILATERAL MAMMOGRAM WITH TOMOSYNTHESIS AND CAD;
ULTRASOUND LEFT BREAST LIMITED
TECHNIQUE: Bilateral digital diagnostic mammography and breast tomosynthesis
was performed. The images were evaluated with computer-aided
detection.; Targeted ultrasound examination of the left breast was
performed.

[R MLO synth-2D]
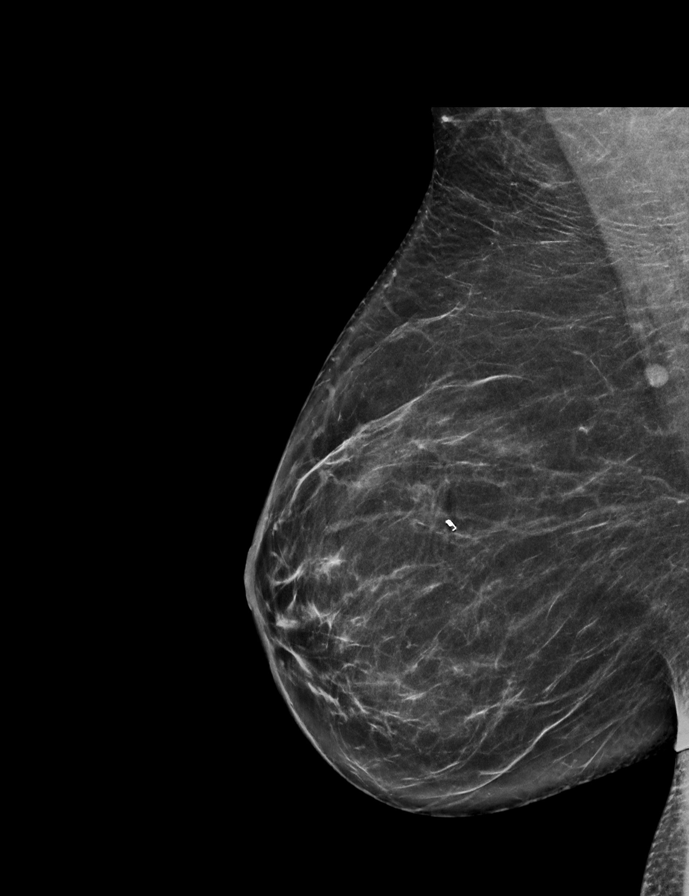

[L CC synth-2D]
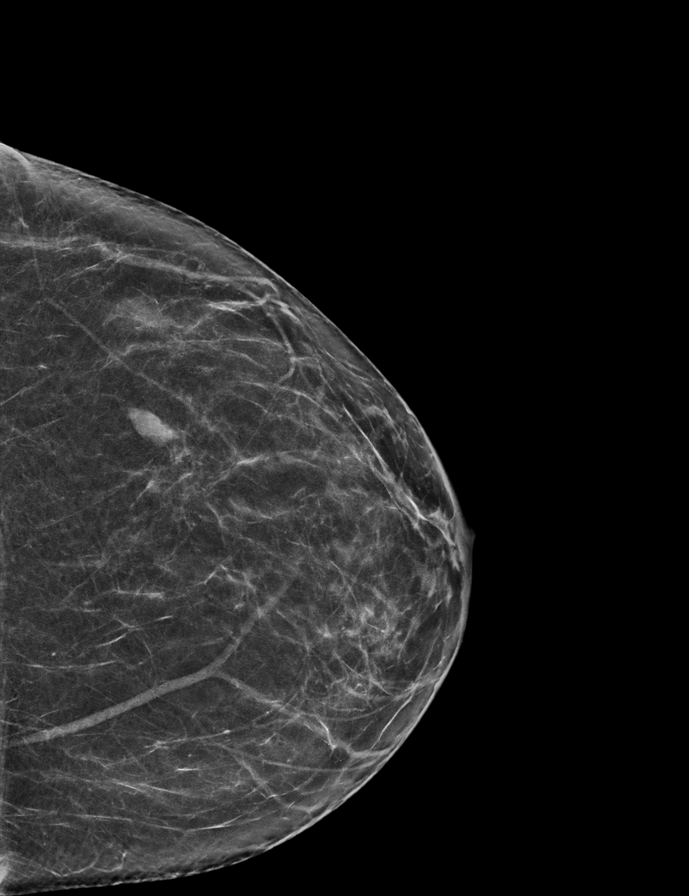

[R CC synth-2D]
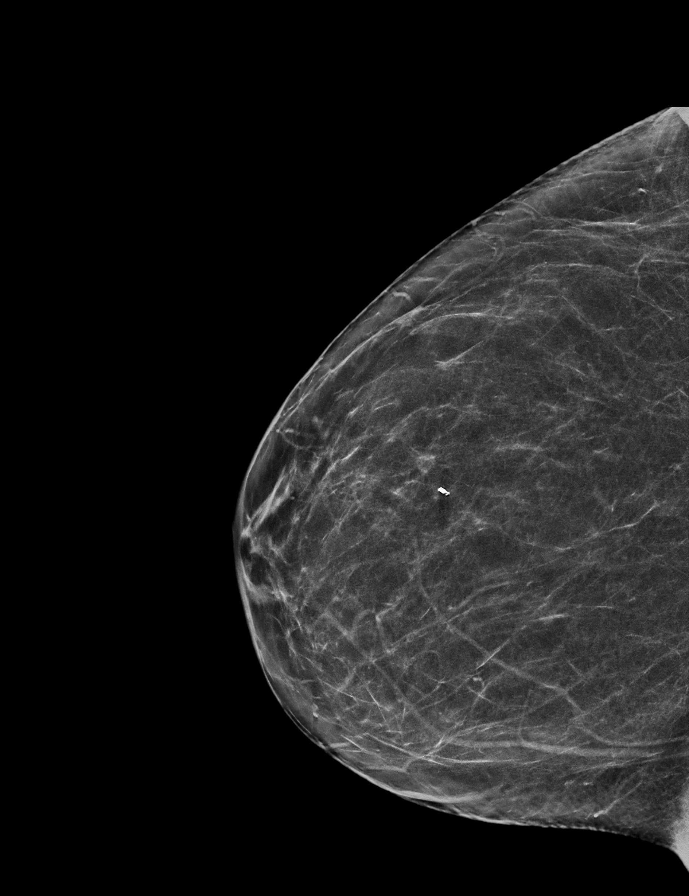

[L MLO synth-2D]
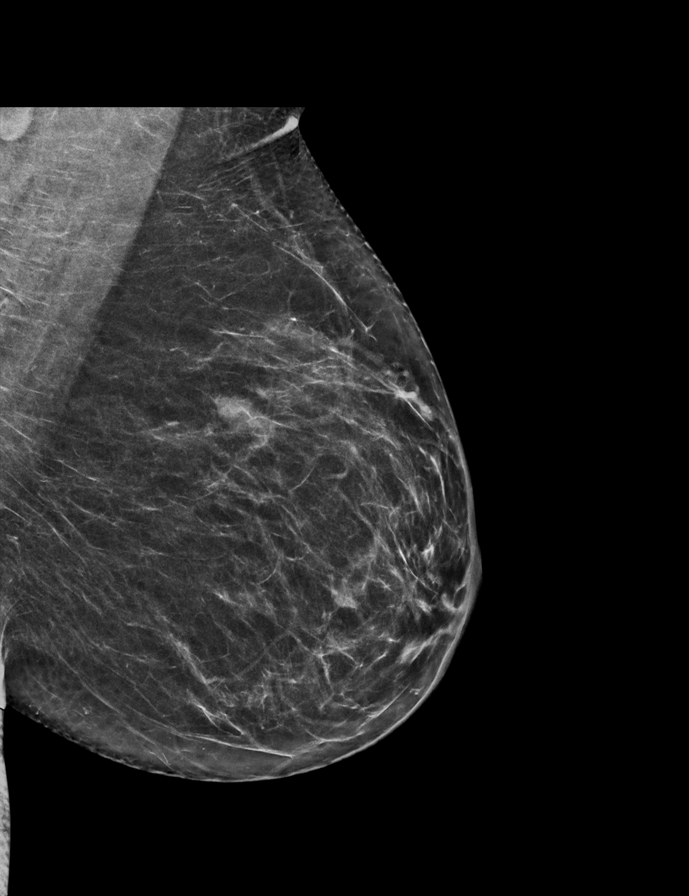

[L CC tomo · tomo slice 31/60.0]
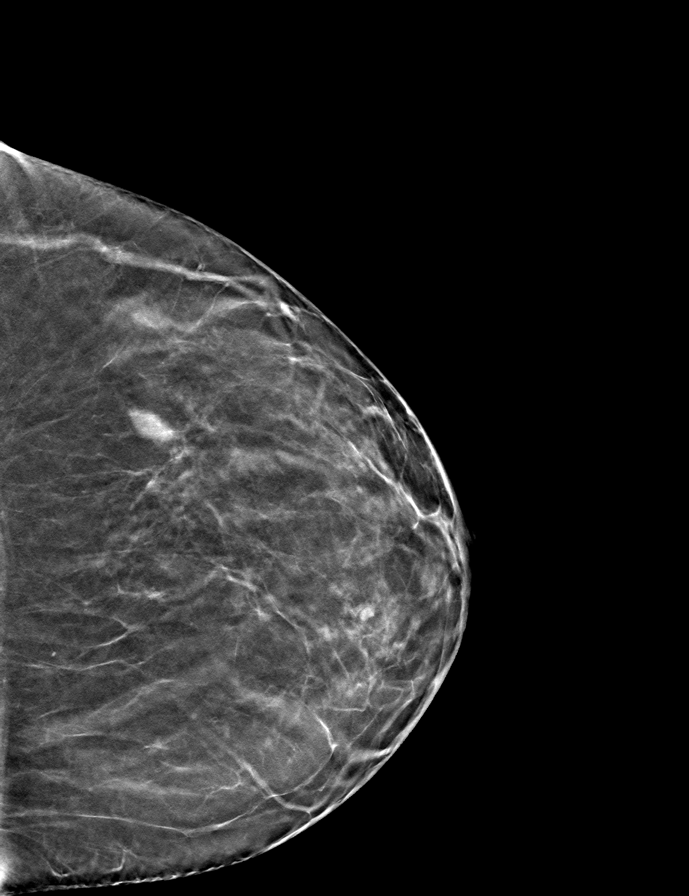

[R MLO tomo · tomo slice 35/70.0]
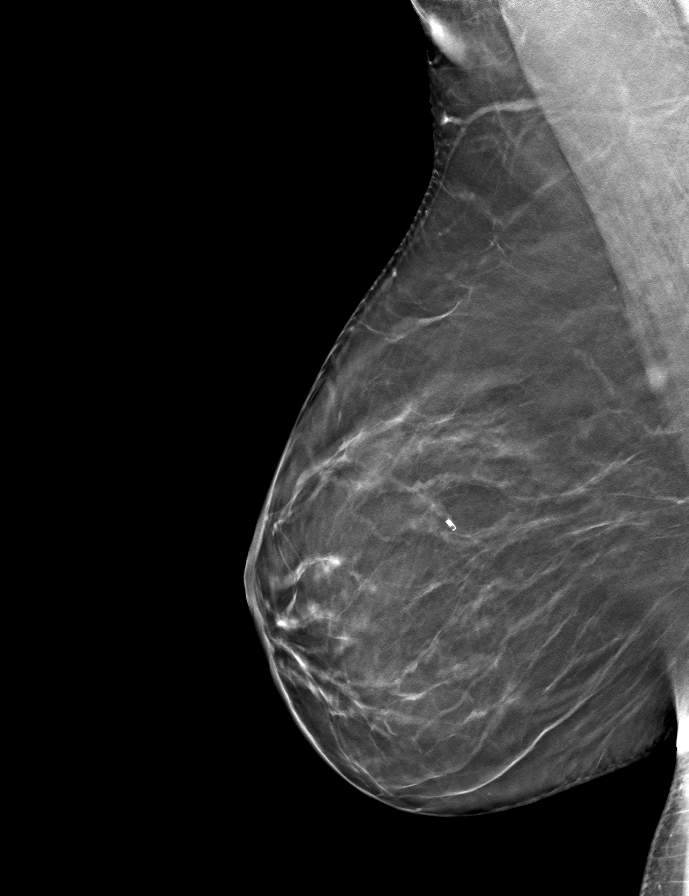

[L MLO tomo · tomo slice 33/65.0]
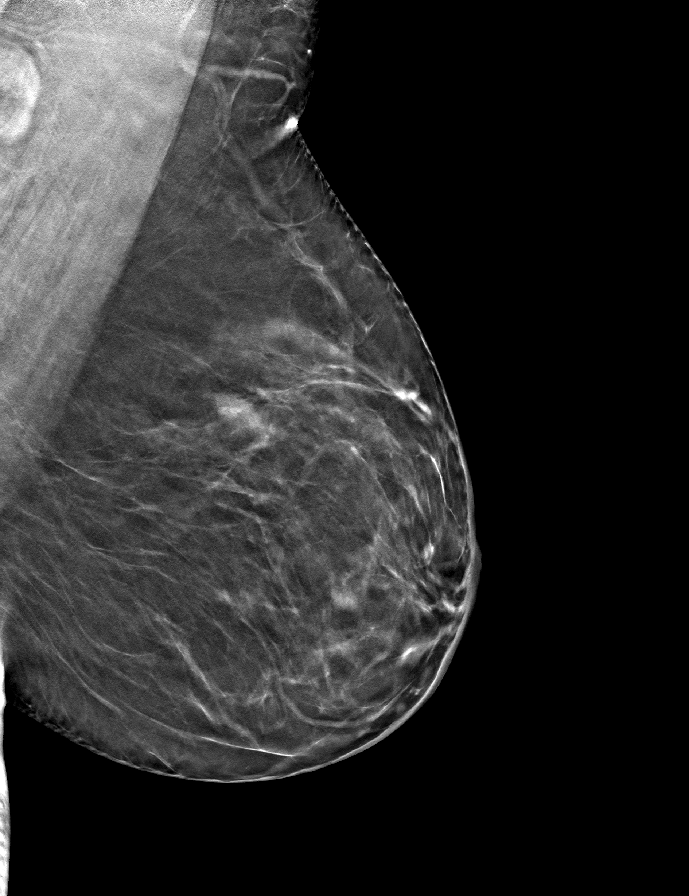

[R CC tomo · tomo slice 31/60.0]
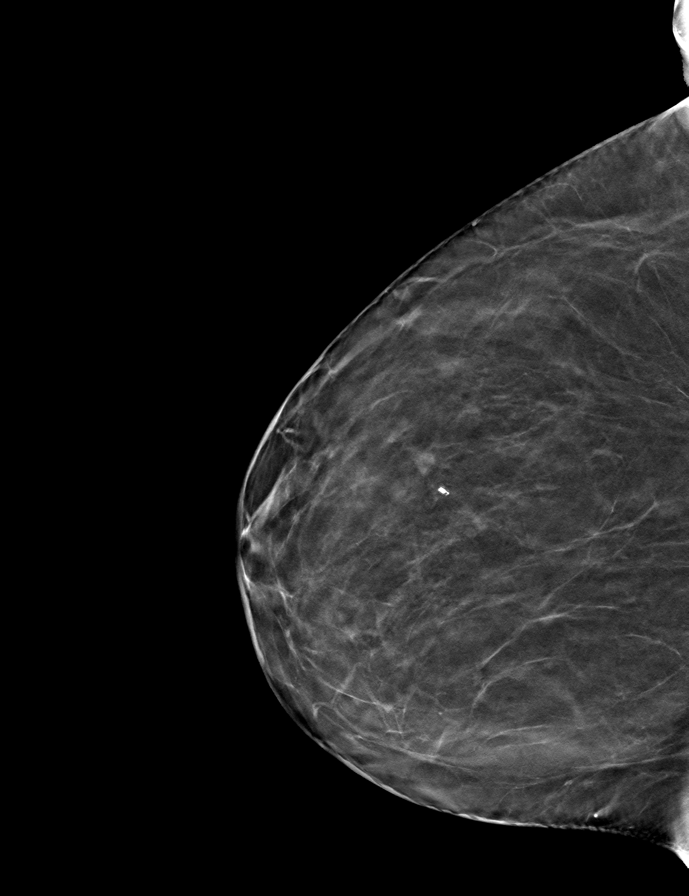

[8 of 24 positions shown; findings below may reference images not displayed]

ACR Breast Density Category b: There are scattered areas of
fibroglandular density.
FINDINGS: Mammogram:

Right breast: No suspicious mass, distortion, or microcalcifications
are identified to suggest presence of malignancy.

Left breast: There are two stable oval circumscribed masses in the
upper outer left breast. No suspicious mass, distortion, or
microcalcifications are identified to suggest presence of
malignancy.

Ultrasound:

Targeted ultrasound performed in the left breast at 2 o'clock 4 cm
from the nipple demonstrating an oval circumscribed hypoechoic mass
measuring 1.0 x 0.3 x 0.9 cm, previously measuring 1.1 x 0.4 x
cm. A second smaller adjacent similar appearing mass measures 0.5 x
0.2 x 0.5 cm, previously measuring 0.5 x 0.2 x 0.5 cm.
IMPRESSION: 1. Stable masses in the left breast at 2 o'clock, which given
stability for over 2 years are considered benign.

2.  No mammographic evidence of malignancy in the right breast.

RECOMMENDATION:
Screening mammogram in one year.(Code:EV-A-YQF)

I have discussed the findings and recommendations with the patient.
If applicable, a reminder letter will be sent to the patient
regarding the next appointment.

BI-RADS CATEGORY  2: Benign.

## 2023-09-28 ENCOUNTER — Other Ambulatory Visit (HOSPITAL_COMMUNITY): Payer: Self-pay

## 2023-09-28 ENCOUNTER — Other Ambulatory Visit: Payer: Self-pay

## 2023-09-28 MED ORDER — LO LOESTRIN FE 1 MG-10 MCG / 10 MCG PO TABS
1.0000 | ORAL_TABLET | Freq: Every day | ORAL | 0 refills | Status: DC
  Filled 2023-09-28 – 2023-10-07 (×4): qty 84, 84d supply, fill #0

## 2023-10-04 ENCOUNTER — Other Ambulatory Visit: Payer: Self-pay

## 2023-10-07 ENCOUNTER — Other Ambulatory Visit (HOSPITAL_COMMUNITY): Payer: Self-pay

## 2023-10-13 ENCOUNTER — Other Ambulatory Visit (HOSPITAL_COMMUNITY): Payer: Self-pay

## 2023-10-13 MED ORDER — LO LOESTRIN FE 1 MG-10 MCG / 10 MCG PO TABS
1.0000 | ORAL_TABLET | Freq: Every day | ORAL | 0 refills | Status: DC
  Filled 2023-10-13 – 2023-10-14 (×2): qty 112, 84d supply, fill #0

## 2023-10-14 ENCOUNTER — Other Ambulatory Visit: Payer: Self-pay

## 2023-10-14 ENCOUNTER — Other Ambulatory Visit (HOSPITAL_COMMUNITY): Payer: Self-pay

## 2023-12-31 DIAGNOSIS — Z0000 Encounter for general adult medical examination without abnormal findings: Secondary | ICD-10-CM | POA: Diagnosis not present

## 2023-12-31 DIAGNOSIS — Z0001 Encounter for general adult medical examination with abnormal findings: Secondary | ICD-10-CM | POA: Diagnosis not present

## 2023-12-31 DIAGNOSIS — Z23 Encounter for immunization: Secondary | ICD-10-CM | POA: Diagnosis not present

## 2023-12-31 DIAGNOSIS — R252 Cramp and spasm: Secondary | ICD-10-CM | POA: Diagnosis not present

## 2024-01-03 ENCOUNTER — Other Ambulatory Visit (HOSPITAL_COMMUNITY): Payer: Self-pay

## 2024-01-03 MED ORDER — LO LOESTRIN FE 1 MG-10 MCG / 10 MCG PO TABS
ORAL_TABLET | ORAL | 0 refills | Status: DC
  Filled 2024-01-03: qty 112, 90d supply, fill #0

## 2024-01-07 ENCOUNTER — Other Ambulatory Visit (HOSPITAL_COMMUNITY): Payer: Self-pay

## 2024-01-07 DIAGNOSIS — Z6829 Body mass index (BMI) 29.0-29.9, adult: Secondary | ICD-10-CM | POA: Diagnosis not present

## 2024-01-07 DIAGNOSIS — Z01419 Encounter for gynecological examination (general) (routine) without abnormal findings: Secondary | ICD-10-CM | POA: Diagnosis not present

## 2024-01-07 DIAGNOSIS — R6882 Decreased libido: Secondary | ICD-10-CM | POA: Diagnosis not present

## 2024-01-07 DIAGNOSIS — N898 Other specified noninflammatory disorders of vagina: Secondary | ICD-10-CM | POA: Diagnosis not present

## 2024-01-07 DIAGNOSIS — N926 Irregular menstruation, unspecified: Secondary | ICD-10-CM | POA: Diagnosis not present

## 2024-01-07 MED ORDER — SERTRALINE HCL 50 MG PO TABS
50.0000 mg | ORAL_TABLET | Freq: Every day | ORAL | 1 refills | Status: AC
  Filled 2024-01-07: qty 90, 90d supply, fill #0

## 2024-01-07 MED ORDER — BLISOVI 24 FE 1-20 MG-MCG(24) PO TABS
1.0000 | ORAL_TABLET | Freq: Every day | ORAL | 4 refills | Status: AC
  Filled 2024-01-07: qty 84, 84d supply, fill #0

## 2024-01-07 MED ORDER — ESTRADIOL 0.01 % VA CREA
TOPICAL_CREAM | Freq: Every evening | VAGINAL | 6 refills | Status: AC
  Filled 2024-01-07: qty 42.5, 84d supply, fill #0

## 2024-01-10 ENCOUNTER — Other Ambulatory Visit (HOSPITAL_COMMUNITY): Payer: Self-pay

## 2024-01-18 ENCOUNTER — Other Ambulatory Visit (HOSPITAL_COMMUNITY): Payer: Self-pay

## 2024-02-03 ENCOUNTER — Other Ambulatory Visit (HOSPITAL_COMMUNITY): Payer: Self-pay | Admitting: Internal Medicine

## 2024-02-03 ENCOUNTER — Other Ambulatory Visit: Payer: Self-pay | Admitting: Internal Medicine

## 2024-02-03 DIAGNOSIS — E66811 Obesity, class 1: Secondary | ICD-10-CM

## 2024-02-03 MED ORDER — TIRZEPATIDE-WEIGHT MANAGEMENT 12.5 MG/0.5ML ~~LOC~~ SOAJ: 12.5000 mg | SUBCUTANEOUS | 2 refills | Status: DC

## 2024-02-03 MED ORDER — TIRZEPATIDE-WEIGHT MANAGEMENT 2.5 MG/0.5ML ~~LOC~~ SOLN: 2.5000 mg | SUBCUTANEOUS | 2 refills | Status: DC

## 2024-02-03 MED ORDER — ZEPBOUND 12.5 MG/0.5ML ~~LOC~~ SOLN: 12.5000 mg | SUBCUTANEOUS | 2 refills | Status: DC

## 2024-02-13 ENCOUNTER — Other Ambulatory Visit: Payer: Self-pay | Admitting: Internal Medicine

## 2024-02-13 MED ORDER — WEGOVY 25 MG PO TABS
25.0000 mg | ORAL_TABLET | Freq: Every day | ORAL | 2 refills | Status: AC
  Filled 2024-02-13: qty 30, 30d supply, fill #0

## 2024-02-14 ENCOUNTER — Encounter (HOSPITAL_COMMUNITY): Payer: Self-pay

## 2024-02-14 ENCOUNTER — Other Ambulatory Visit (HOSPITAL_COMMUNITY): Payer: Self-pay

## 2024-02-19 ENCOUNTER — Other Ambulatory Visit (HOSPITAL_COMMUNITY): Payer: Self-pay
